# Patient Record
Sex: Male | Born: 1980 | Race: Black or African American | Hispanic: No | Marital: Single | Smoking: Never smoker
Health system: Southern US, Community
[De-identification: ages and names within clinical notes are randomized; demographics above are authoritative.]

## PROBLEM LIST (undated history)

## (undated) ENCOUNTER — Inpatient Hospital Stay: Payer: Self-pay | Admitting: Internal Medicine

## (undated) ENCOUNTER — Emergency Department (HOSPITAL_COMMUNITY): Admission: EM | Payer: Self-pay | Source: Home / Self Care

## (undated) DIAGNOSIS — Z8709 Personal history of other diseases of the respiratory system: Secondary | ICD-10-CM

## (undated) DIAGNOSIS — I96 Gangrene, not elsewhere classified: Secondary | ICD-10-CM

## (undated) HISTORY — PX: HAND SURGERY: SHX662

---

## 2015-09-04 ENCOUNTER — Emergency Department (HOSPITAL_COMMUNITY)
Admission: EM | Admit: 2015-09-04 | Discharge: 2015-09-04 | Disposition: A | Discharge status: Home / Self Care | Payer: Medicaid Other | Source: Home / Self Care | Attending: Emergency Medicine | Admitting: Emergency Medicine

## 2015-09-04 ENCOUNTER — Emergency Department (HOSPITAL_COMMUNITY): Payer: Medicaid Other

## 2015-09-04 ENCOUNTER — Encounter (HOSPITAL_COMMUNITY): Payer: Self-pay | Admitting: Emergency Medicine

## 2015-09-04 DIAGNOSIS — J189 Pneumonia, unspecified organism: Secondary | ICD-10-CM

## 2015-09-04 DIAGNOSIS — Z880 Allergy status to penicillin: Secondary | ICD-10-CM

## 2015-09-04 DIAGNOSIS — R519 Headache, unspecified: Secondary | ICD-10-CM

## 2015-09-04 DIAGNOSIS — J159 Unspecified bacterial pneumonia: Secondary | ICD-10-CM

## 2015-09-04 DIAGNOSIS — R51 Headache: Secondary | ICD-10-CM | POA: Insufficient documentation

## 2015-09-04 DIAGNOSIS — R112 Nausea with vomiting, unspecified: Secondary | ICD-10-CM

## 2015-09-04 DIAGNOSIS — R1084 Generalized abdominal pain: Secondary | ICD-10-CM | POA: Insufficient documentation

## 2015-09-04 DIAGNOSIS — R52 Pain, unspecified: Secondary | ICD-10-CM

## 2015-09-04 LAB — CBC WITH DIFFERENTIAL/PLATELET
BASOS ABS: 0 10*3/uL (ref 0.0–0.1)
Basophils Relative: 0 %
EOS ABS: 0 10*3/uL (ref 0.0–0.7)
EOS PCT: 0 %
HCT: 46.2 % (ref 39.0–52.0)
Hemoglobin: 16 g/dL (ref 13.0–17.0)
LYMPHS PCT: 18 %
Lymphs Abs: 0.8 10*3/uL (ref 0.7–4.0)
MCH: 30.2 pg (ref 26.0–34.0)
MCHC: 34.6 g/dL (ref 30.0–36.0)
MCV: 87.2 fL (ref 78.0–100.0)
MONO ABS: 0.7 10*3/uL (ref 0.1–1.0)
Monocytes Relative: 16 %
Neutro Abs: 2.8 10*3/uL (ref 1.7–7.7)
Neutrophils Relative %: 66 %
PLATELETS: 142 10*3/uL — AB (ref 150–400)
RBC: 5.3 MIL/uL (ref 4.22–5.81)
RDW: 12.3 % (ref 11.5–15.5)
WBC: 4.3 10*3/uL (ref 4.0–10.5)

## 2015-09-04 LAB — URINALYSIS, ROUTINE W REFLEX MICROSCOPIC
Glucose, UA: NEGATIVE mg/dL
Hgb urine dipstick: NEGATIVE
LEUKOCYTES UA: NEGATIVE
NITRITE: NEGATIVE
PROTEIN: 30 mg/dL — AB
Specific Gravity, Urine: 1.033 — ABNORMAL HIGH (ref 1.005–1.030)
pH: 7.5 (ref 5.0–8.0)

## 2015-09-04 LAB — COMPREHENSIVE METABOLIC PANEL
ALT: 21 U/L (ref 17–63)
AST: 37 U/L (ref 15–41)
Albumin: 3.9 g/dL (ref 3.5–5.0)
Alkaline Phosphatase: 65 U/L (ref 38–126)
Anion gap: 14 (ref 5–15)
BUN: 12 mg/dL (ref 6–20)
CHLORIDE: 102 mmol/L (ref 101–111)
CO2: 20 mmol/L — AB (ref 22–32)
Calcium: 8.6 mg/dL — ABNORMAL LOW (ref 8.9–10.3)
Creatinine, Ser: 1.29 mg/dL — ABNORMAL HIGH (ref 0.61–1.24)
Glucose, Bld: 106 mg/dL — ABNORMAL HIGH (ref 65–99)
POTASSIUM: 3.2 mmol/L — AB (ref 3.5–5.1)
SODIUM: 136 mmol/L (ref 135–145)
Total Bilirubin: 0.7 mg/dL (ref 0.3–1.2)
Total Protein: 7 g/dL (ref 6.5–8.1)

## 2015-09-04 LAB — URINE MICROSCOPIC-ADD ON
Bacteria, UA: NONE SEEN
RBC / HPF: NONE SEEN RBC/hpf (ref 0–5)

## 2015-09-04 LAB — LIPASE, BLOOD: LIPASE: 32 U/L (ref 11–51)

## 2015-09-04 LAB — RAPID STREP SCREEN (MED CTR MEBANE ONLY): STREPTOCOCCUS, GROUP A SCREEN (DIRECT): NEGATIVE

## 2015-09-04 MED ORDER — DICYCLOMINE HCL 20 MG PO TABS: 20.0000 mg | ORAL_TABLET | Freq: Two times a day (BID) | ORAL | Status: DC

## 2015-09-04 MED ORDER — BENZONATATE 100 MG PO CAPS: 100.0000 mg | ORAL_CAPSULE | Freq: Three times a day (TID) | ORAL | Status: DC

## 2015-09-04 MED ORDER — ONDANSETRON HCL 4 MG/2ML IJ SOLN
4.0000 mg | Freq: Once | INTRAMUSCULAR | Status: AC
  Administered 2015-09-04: 4 mg via INTRAVENOUS
  Filled 2015-09-04: qty 2

## 2015-09-04 MED ORDER — SODIUM CHLORIDE 0.9 % IV BOLUS (SEPSIS)
1000.0000 mL | Freq: Once | INTRAVENOUS | Status: AC
  Administered 2015-09-04: 1000 mL via INTRAVENOUS

## 2015-09-04 MED ORDER — SODIUM CHLORIDE 0.9 % IV SOLN
1000.0000 mL | Freq: Once | INTRAVENOUS | Status: AC
  Administered 2015-09-04: 1000 mL via INTRAVENOUS

## 2015-09-04 MED ORDER — SODIUM CHLORIDE 0.9 % IV SOLN: 1000.0000 mL | Freq: Once | INTRAVENOUS | Status: DC

## 2015-09-04 MED ORDER — SODIUM CHLORIDE 0.9 % IV SOLN: 1000.0000 mL | INTRAVENOUS | Status: DC

## 2015-09-04 MED ORDER — AZITHROMYCIN 250 MG PO TABS: 250.0000 mg | ORAL_TABLET | Freq: Every day | ORAL | Status: DC

## 2015-09-04 MED ORDER — KETOROLAC TROMETHAMINE 30 MG/ML IJ SOLN
30.0000 mg | Freq: Once | INTRAMUSCULAR | Status: AC
  Administered 2015-09-04: 30 mg via INTRAVENOUS
  Filled 2015-09-04: qty 1

## 2015-09-04 MED ORDER — DOXYCYCLINE HYCLATE 100 MG PO TABS: 100.0000 mg | ORAL_TABLET | Freq: Once | ORAL | Status: DC

## 2015-09-04 MED ORDER — AZITHROMYCIN 250 MG PO TABS
500.0000 mg | ORAL_TABLET | Freq: Once | ORAL | Status: AC
  Administered 2015-09-04: 500 mg via ORAL
  Filled 2015-09-04: qty 2

## 2015-09-04 MED ORDER — DICYCLOMINE HCL 10 MG PO CAPS
10.0000 mg | ORAL_CAPSULE | Freq: Once | ORAL | Status: AC
  Administered 2015-09-04: 10 mg via ORAL
  Filled 2015-09-04: qty 1

## 2015-09-04 MED ORDER — ONDANSETRON 4 MG PO TBDP: 4.0000 mg | ORAL_TABLET | Freq: Three times a day (TID) | ORAL | Status: DC | PRN

## 2015-09-04 NOTE — ED Notes (Signed)
Patient's family called asking why patient has not been seen yet. I explained that the patient was being actively cared for and that we were very busy and the doctor is currently rounding on patients and will get to him soon. Family asked if he needs to go to another hospital and I explained that if he did leave that he would need to go through this process all over again somewhere else and that he was still being actively cared for here. The family replied "fine" and hung up the phone.

## 2015-09-04 NOTE — ED Notes (Signed)
Pt stated "I've been sick x 4 days.  My body hurts all over, my stomach hurts, not really vomiting.  I took 1 abx this morning that someone gave me."  Pt denies diarrhea/vomiting.

## 2015-09-04 NOTE — ED Provider Notes (Signed)
Medical screening examination/treatment/procedure(s) were conducted as a shared visit with non-physician practitioner(s) and myself.  I personally evaluated the patient during the encounter.   EKG Interpretation None     Pt here with flu like sx x 4 days consisting of myalgias, sore throat, abd discomfort PE: abd soft, w/o focal tenderness Neck w/o meningismus  A/P: suspect viral illness Will d/c once hydrated  Lorre NickAnthony Tamu Golz, MD 09/04/15 [phone]

## 2015-09-04 NOTE — ED Notes (Signed)
Bed: [id number] Expected date: 09/04/15 Expected time: 1:40 PM Means of arrival:  Comments: N/V

## 2015-09-04 NOTE — ED Notes (Signed)
pts family came to asking about pt being seen explained that he just got here and that we are waiting on md to see pt. Explained that he has had fluids and zofran from ems. Given ice chips per pts request due to he was upset that he could not have anything to drink. Family wants to talk with pt to have him released to [facility]. Expressed that he would have to sign out AMA since the MD hasnot seen him. Explained also that we have been very busy with pts and that the MD will come see pt as soon as possible. Family states that he will talk to pt and let us know if he wants him to leave.

## 2015-09-04 NOTE — Discharge Instructions (Signed)
You have been seen today for body aches, nausea, vomiting, abdominal pain, and headache. Your imaging and lab tests showed no abnormalities. Her presentation is consistent with a viral illness, such as the flu. The treatment for viral illnesses is supportive care. Drink plenty of fluids and get plenty of rest. Zofran for nausea. Ibuprofen or Tylenol for fever or pain. Bentyl for abdominal discomfort. Follow up with PCP as needed. Return to ED should symptoms worsen.   Emergency Department Resource Guide 1) Find a Doctor and Pay Out of Pocket Although you won't have to find out who is covered by your insurance plan, it is a good idea to ask around and get recommendations. You will then need to call the office and see if the doctor you have chosen will accept you as a new patient and what types of options they offer for patients who are self-pay. Some doctors offer discounts or will set up payment plans for their patients who do not have insurance, but you will need to ask so you aren't surprised when you get to your appointment.  2) Contact Your Local Health Department Not all health departments have doctors that can see patients for sick visits, but many do, so it is worth a call to see if yours does. If you don't know where your local health department is, you can check in your phone book. The CDC also has a tool to help you locate your state's health department, and many state websites also have listings of all of their local health departments.  3) Find a Walk-in Clinic If your illness is not likely to be very severe or complicated, you may want to try a walk in clinic. These are popping up all over the country in pharmacies, drugstores, and shopping centers. They're usually staffed by nurse practitioners or physician assistants that have been trained to treat common illnesses and complaints. They're usually fairly quick and inexpensive. However, if you have serious medical issues or chronic medical  problems, these are probably not your best option.  No Primary Care Doctor: - Call Health Connect at  [phone] - they can help you locate a primary care doctor that  accepts your insurance, provides certain services, etc. - Physician Referral Service- [phone]  Chronic Pain Problems: Organization         Address  Phone   Notes  [facility]  [phone] Patients need to be referred by their primary care doctor.   Medication Assistance: Organization         Address  Phone   Notes  [facility] Medication [facility]ssistance Program [address] [place], [place] [phone] --Must be a resident of [facility] [place] -- Must have NO insurance coverage whatsoever (no Medicaid/ Medicare, etc.) -- The pt. MUST have a primary care doctor that directs their care regularly and follows them in the community   MedAssist  [phone]   [employer]  [phone]) [phone]    Agencies that provide inexpensive medical care: Organization         Address  Phone   Notes  [facility]  [phone]) [phone]   [facility]    [phone]   [facility] [address] [place], [place] [address] [phone]   [facility] [address], [place] [phone]) [phone]   [facility]anned Parenthood    [phone]   [facility]    [phone])  [phone]   [facility] and [employer]  [address], [place] Phone:  [phone], Fax:  [phone] Hours of Operation:  9 am - 6 pm, M-F.  Also accepts Medicaid/Medicare and self-pay.  [facility]  [address], [place] Phone: [phone], Fax: [phone]. Hours of Operation:  8:30 am - 5:30 pm, M-F.  Also accepts Medicaid and self-pay.  [facility] [place] [address], [place] Phone: [phone]   [facility] [address] Natasha Bence [place], [place] [phone], Ext. [phone]  Mondays & Thursdays: 7-9 AM.  First 15 patients are seen on a first come, first serve basis.    Medicaid-accepting [facility] Providers:  Organization         Address  Phone   Notes  [facility] [address], [place] [phone] Also accepts self-pay patients.  [facility] [address] Laurell Josephs [place], [place]  [phone]   [facility] [address], [place] [phone]   [facility] [address] [place] [phone]   Renaye Rakers [address], [place]   [phone] Only accepts [place] Access [place] patients after they have their name applied to their card.   Self-Pay (no insurance) in [facility]:  Organization         Address  Phone   Notes  Sickle Cell Patients, [facility] [address] [place], [place] [phone]   [facility] [address] [place], [place] [phone]   [facility] [place]  [address], [place] [phone]   [facility]/Dr. Osei-Bonsu  [address] [place] or [address], [place] [phone] Phone number for both [place] and [place] locations is the same.  Urgent Medical and [facility] [address], [place] [phone]   [facility] [address] [place] or [address] [phone]   [facility] [address] [place] [phone], phone; [phone], fax Sees patients 1st and 3rd Saturday of every month.  Must not qualify for public or private insurance (i.e. Medicaid, Medicare, [place] Health Choice, Veterans' Benefits)  Household income should be no more than 200% of the poverty level The clinic cannot treat you if you are pregnant or think you are pregnant  Sexually transmitted diseases are not treated at  the clinic.    Dental Care: Organization         Address  Phone  Notes  [facility] [address] [place], [place] [phone] Accepts children up to age 57 who are enrolled in [place] or [place] Health Choice; pregnant women with a Medicaid card; and children who have applied for Medicaid or [place] Health Choice, but were declined, whose parents can pay a reduced fee at time of service.  [facility] of [facility]  [address], [place] [phone] Accepts children up to age 46 who are enrolled in [place] or [place] Health Choice; pregnant women with a Medicaid card; and children who have applied for Medicaid or  Health Choice, but were declined, whose parents can pay a reduced fee at time of service.  [facility]  PROGRAM  [place] [phone] Patients are seen by appointment only. Walk-ins are not accepted. [place] will see patients 63 years of age and older. Monday - Tuesday (8am-5pm) Most Wednesdays (8:30-5pm) $30 per visit, cash only  [facility] Adult Dental Access PROGRAM  [address], [facility] [phone] Patients are seen by appointment only. Walk-ins are not accepted. [place] will see patients 39 years of age and older. One Wednesday Evening (Monthly: Volunteer Based).  $30 per visit, cash only  [place]  [phone] for adults; Children under age 63, call Graduate Pediatric Dentistry at [phone]. Children aged 87-14, please call [phone] to request a pediatric application.  Dental services are provided in all areas of dental care including fillings, crowns and bridges, complete and partial dentures, implants, gum treatment, root canals, and extractions. Preventive care is also provided. Treatment is provided to both adults and children. Patients are selected via a lottery and there is often a  waiting list.   [facility] [address], [place]  [phone] [email or web address]   [facility] [address] [place], [place] [phone], Ext. [phone] Second and Fourth Thursday of each month, opens at 6:30 AM; Clinic ends at 9 AM.  Patients are seen on a first-come first-served basis, and a limited number are seen during each clinic.   [facility]  [address] Hillard Danker [place], [place] [phone]   Eligibility Requirements You must have lived in [place], [place], or [place] counties for at least the last three months.   You cannot be eligible for state or federal sponsored [employer], including [employer], [place], or [employer].   You generally cannot be eligible for healthcare insurance through your employer.    How to apply: Eligibility screenings are held every Tuesday and Wednesday afternoon from 1:00 pm until 4:00 pm. You do not need an appointment for the interview!  [facility] [address] [place], [place]   [place]  [place] Department  [place]  [phone]    Behavioral Health Resources in the Community: Intensive Outpatient Programs Organization         Address  Phone  Notes  [place]. [address] [place], [place] [phone]   [facility] Outpatient [address] [place], [place]   ADS: Alcohol [facility] Drug S[facility] [address] [place], [place]   [place] [address]  [place], [place] or [phone]   Substance Abuse Resources Organization         Address  Phone  Notes  Alcohol and Drug Services  [phone]   [place]  [phone]   [facility] [place]   Chinita Pester  [phone]   Residential & Outpatient Substance Abuse  Program  [phone]   Psychological Services Organization         Address  Phone  Notes  [facility] [place]  [place]  [phone]   [place] [address], [place] or [phone]    Mobile Crisis Teams Organization         Address  Phone  Notes  [facility], Mobile Crisis Care Unit  [phone]   [facility]  [address] [place], [place]  [place], [place] [phone]    Self-Help/Support Groups Organization         Address  Phone             Notes  [facility] of [place] - variety of support groups  [place] Call for more information  Narcotics Anonymous (NA), [facility]aring [facility] [address], [employer] [place]  2 meetings at this location   Special educational needs teacher         Address  Phone  Notes  [facility]idential Treatment [place],    [place]  [phone]   [facility]  [address], [place] [phone], [place], [place]   [place], [place] [phone] Admissions: 8am-3pm M-F  Incentives Substance [place] [address]    [place], [place] [phone]   [facility] [address] [place], [place], [place]   [facility] [address]  [place], [place]   [facility] Programs - Intensive Outpatient [address] Kristeen Mans [address], [place], [place]   [facility] ([place].,  [place], [place] [phone] or [phone]   [facility] (RTS) [address], [place], [place] Accepts Medicaid  [facility] [place] [address]  [place] [phone] Substance Abuse/Addiction Treatment   [facility] Organization          Address  Phone  Notes  [facility]  [phone]   Domenic Schwab, PhD [address] Arlis Porta [place], [place]   [phone] or [phone]   [place] Baxter [place], [place] ([phone]   [facility] [address] [place], [place] [phone] Insurance/Medicaid/sponsorship through [facility] and Families [address]                                    [place], [place] [phone] [place] [address], [place] [phone]    Dr. Adele Schilder  [phone]   [facility] Dept. 1) [address], [place] 2) [place] 3)  [address], [place] [phone]  [phone]   [place] [phone] or [phone] (After Hours)

## 2015-09-04 NOTE — ED Notes (Signed)
Patient transported to X-ray 

## 2015-09-04 NOTE — ED Notes (Signed)
Awake. Verbally responsive. A/O x4. Resp even and unlabored. No audible adventitious breath sounds noted. ABC's intact.  

## 2015-09-04 NOTE — ED Notes (Addendum)
N/v for the past 4 days now, has not checked temp. Drinking minimal fluids, taking day quil not helping. Coughing trigger emesis and worse at night. 4mg  zofran with ems no witnesses emesis since cbg 124

## 2015-09-04 NOTE — ED Provider Notes (Signed)
CSN: [id number]     Arrival date & time 09/04/15  1343 History   First MD Initiated Contact with Patient 09/04/15 1457     Chief Complaint  Patient presents with  . Nausea  . Emesis     (Consider location/radiation/quality/duration/timing/severity/associated sxs/prior Treatment) HPI   Matthew [name] is a 35 y.o. male, patient with no pertinent past medical history, presenting to the ED with body aches, nausea, abdominal pain, subjective fever, and headache for the last 4 days. Pt endorses vomiting during the first two days of his illness. Pt denies sick contacts. Pt states his abdominal pain is periumbilical, 10/10, "feels like a knot," nonradiating. States the headache is bilateral, "all over," rates it 10/10, throbbing, nonradiating. Pt has not taken any medications. Pt denies chills, diarrhea/constipation, chest pain, cough, shortness of breath, or any other pain or complaints.   History reviewed. No pertinent past medical history. History reviewed. No pertinent past surgical history. No family history on file. Social History  Substance Use Topics  . Smoking status: None  . Smokeless tobacco: None  . Alcohol Use: None    Review of Systems  Constitutional: Positive for fever. Negative for chills and diaphoresis.  Respiratory: Negative for cough and shortness of breath.   Cardiovascular: Negative for chest pain.  Gastrointestinal: Positive for nausea, vomiting and abdominal pain. Negative for diarrhea and constipation.  Genitourinary: Negative for dysuria and flank pain.  Musculoskeletal: Positive for myalgias. Negative for neck stiffness.  Skin: Negative for color change and pallor.  Neurological: Positive for headaches. Negative for dizziness, weakness, light-headedness and numbness.  All other systems reviewed and are negative.     Allergies  Penicillins  Home Medications   Prior to Admission medications   Medication Sig Start Date End Date Taking? Authorizing Provider   azithromycin (ZITHROMAX) 250 MG tablet Take 1 tablet (250 mg total) by mouth daily. Take first 2 tablets together, then 1 every day until finished. 09/05/15   Shawn C Joy, PA-C  benzonatate (TESSALON) 100 MG capsule Take 1 capsule (100 mg total) by mouth every 8 (eight) hours. 09/04/15   Shawn C Joy, PA-C  dicyclomine (BENTYL) 20 MG tablet Take 1 tablet (20 mg total) by mouth 2 (two) times daily. 09/04/15   Shawn C Joy, PA-C  ondansetron (ZOFRAN ODT) 4 MG disintegrating tablet Take 1 tablet (4 mg total) by mouth every 8 (eight) hours as needed for nausea or vomiting. 09/04/15   Shawn C Joy, PA-C   BP 114/61 mmHg  Pulse 84  Temp(Src) 99.2 F (37.3 C) (Oral)  Resp 16  SpO2 100% Physical Exam  Constitutional: He is oriented to person, place, and time. He appears well-developed and well-nourished. No distress.  HENT:  Head: Normocephalic and atraumatic.  Mouth/Throat: Uvula is midline and mucous membranes are normal. Posterior oropharyngeal erythema present. No oropharyngeal exudate or tonsillar abscesses.  Eyes: Conjunctivae and EOM are normal. Pupils are equal, round, and reactive to light.  Neck: Normal range of motion. Neck supple.  Cardiovascular: Normal rate, regular rhythm, normal heart sounds and intact distal pulses.   Pulmonary/Chest: Effort normal and breath sounds normal. No respiratory distress.  Abdominal: Soft. Normal appearance and bowel sounds are normal. There is generalized tenderness. There is no rigidity, no tenderness at McBurney's point and negative Murphy's sign.  Patient voices generalized tenderness in the abdomen with no point tenderness and no rebound tenderness.  Musculoskeletal: He exhibits no edema or tenderness.  Lymphadenopathy:    He has no cervical adenopathy.  Neurological: He is alert and oriented to person, place, and time. He has normal reflexes.  No sensory deficits. Strength 5/5 in all extremities. No gait disturbance. Coordination intact. Cranial nerves  III-XII grossly intact. No facial droop.   Skin: Skin is warm and dry. He is not diaphoretic.  Nursing note and vitals reviewed.   ED Course  Procedures (including critical care time) Labs Review Labs Reviewed  CBC WITH DIFFERENTIAL/PLATELET - Abnormal; Notable for the following:    Platelets 142 (*)    All other components within normal limits  COMPREHENSIVE METABOLIC PANEL - Abnormal; Notable for the following:    Potassium 3.2 (*)    CO2 20 (*)    Glucose, Bld 106 (*)    Creatinine, Ser 1.29 (*)    Calcium 8.6 (*)    All other components within normal limits  URINALYSIS, ROUTINE W REFLEX MICROSCOPIC (NOT AT [facility]) - Abnormal; Notable for the following:    Color, Urine AMBER (*)    Specific Gravity, Urine 1.033 (*)    Bilirubin Urine SMALL (*)    Ketones, ur >80 (*)    Protein, ur 30 (*)    All other components within normal limits  URINE MICROSCOPIC-ADD ON - Abnormal; Notable for the following:    Squamous Epithelial / LPF 0-5 (*)    All other components within normal limits  RAPID STREP SCREEN (NOT AT [facility])  CULTURE, GROUP A STREP  LIPASE, BLOOD    Imaging Review Dg Chest 2 View  09/04/2015  CLINICAL DATA:  Weakness and cough.  Fever EXAM: CHEST  2 VIEW COMPARISON:  None FINDINGS: Patchy density in the left base is noted, suspicious for pneumonia. Right lung is clear. No pleural effusion or edema. No airspace consolidation. IMPRESSION: 1. Left base opacity suspicious for pneumonia. Electronically Signed   By: Signa Kellaylor  Stroud M.D.   On: 09/04/2015 17:00   I have personally reviewed and evaluated these images and lab results as part of my medical decision-making.   EKG Interpretation None      Medications  ondansetron (ZOFRAN) injection 4 mg (not administered)  sodium chloride 0.9 % bolus 1,000 mL (0 mLs Intravenous Stopped 09/04/15 1645)  ketorolac (TORADOL) 30 MG/ML injection 30 mg (30 mg Intravenous Given 09/04/15 1613)  ondansetron (ZOFRAN) injection 4 mg (4 mg  Intravenous Given 09/04/15 1612)  dicyclomine (BENTYL) capsule 10 mg (10 mg Oral Given 09/04/15 1603)  0.9 %  sodium chloride infusion (0 mLs Intravenous Stopped 09/04/15 1754)  azithromycin (ZITHROMAX) tablet 500 mg (500 mg Oral Given 09/04/15 1752)     MDM   Final diagnoses:  Generalized abdominal pain  Non-intractable vomiting with nausea, vomiting of unspecified type  Body aches  Acute nonintractable headache, unspecified headache type  CAP (community acquired pneumonia)    Matthew BearsJay [name] presents with body aches, nausea and vomiting, abdominal pain, and headache for the last 4 days.  Findings and plan of care discussed with Lorre NickAnthony Allen, MD.  This patient's presentation is consistent with a possible viral illness such as influenza. Although he voices generalized tenderness in the abdomen, upon repeat exam he endorses no tenderness or pain. Patient's pain and nausea were able to be controlled with mildly conservative management here in the ED. Upon Dr. Eliot FordAllen's assessment the patient then voices, "also my throat feels weird." Patient does not endorse any pain in his throat at this time. Patient also adds that he has had a nonproductive cough over the last 2-3 days. Patient's chest x-ray shows  a sitting suspicious for left lower lobe pneumonia. Suspect that the patient's increased creatinine is probably due to dehydration. Patient is nontoxic appearing, is afebrile, not tachycardic, not tachypneic, is normotensive, and maintains SPO2 100% on room air. 5:34 PM On reassessment patient remains pain-free with no changes in his abdominal exam. Patient appears to be appropriate for outpatient management. Patient readily passes a fluid challenge. The patient was given instructions for home care as well as return precautions. Patient voices understanding of these instructions, accepts the plan, and is comfortable with discharge.  Filed Vitals:   09/04/15 1411 09/04/15 1526 09/04/15 1706  BP: 105/88  114/61   Pulse: 85  84  Temp:  99.2 F (37.3 C)   TempSrc:  Oral   Resp: 18  16  SpO2: 100%  100%     Anselm Pancoast, PA-C 09/04/15 1846  Lorre Nick, MD 09/07/15 [phone]

## 2015-09-04 NOTE — ED Notes (Signed)
Pt provided Sprite for fluid challenged & instructed to take small sips.  Pt verbalized understanding.

## 2015-09-04 NOTE — ED Notes (Signed)
Pt informed needs to provide urine specimen.  Pt verbalized understanding.

## 2015-09-04 NOTE — ED Notes (Signed)
Pt spitting up clear fluid while staff at bedside, no emesis seen. Pt states that he was coughing and began to "spit up"

## 2015-09-05 ENCOUNTER — Inpatient Hospital Stay (HOSPITAL_COMMUNITY): Payer: Medicaid Other

## 2015-09-05 ENCOUNTER — Inpatient Hospital Stay (HOSPITAL_COMMUNITY)
Admission: EM | Admit: 2015-09-05 | Discharge: 2015-09-06 | DRG: 003 | Disposition: A | Discharge status: Other Acute Inpatient Hospital | Payer: Medicaid Other | Attending: Pulmonary Disease | Admitting: Pulmonary Disease

## 2015-09-05 ENCOUNTER — Encounter (HOSPITAL_COMMUNITY): Payer: Self-pay | Admitting: *Deleted

## 2015-09-05 ENCOUNTER — Emergency Department (HOSPITAL_COMMUNITY): Payer: Medicaid Other

## 2015-09-05 DIAGNOSIS — I400 Infective myocarditis: Secondary | ICD-10-CM | POA: Diagnosis present

## 2015-09-05 DIAGNOSIS — I248 Other forms of acute ischemic heart disease: Secondary | ICD-10-CM | POA: Diagnosis present

## 2015-09-05 DIAGNOSIS — J1000 Influenza due to other identified influenza virus with unspecified type of pneumonia: Secondary | ICD-10-CM | POA: Diagnosis present

## 2015-09-05 DIAGNOSIS — R000 Tachycardia, unspecified: Secondary | ICD-10-CM

## 2015-09-05 DIAGNOSIS — R042 Hemoptysis: Secondary | ICD-10-CM | POA: Diagnosis present

## 2015-09-05 DIAGNOSIS — D61818 Other pancytopenia: Secondary | ICD-10-CM | POA: Diagnosis present

## 2015-09-05 DIAGNOSIS — I472 Ventricular tachycardia: Secondary | ICD-10-CM | POA: Diagnosis present

## 2015-09-05 DIAGNOSIS — E876 Hypokalemia: Secondary | ICD-10-CM | POA: Diagnosis present

## 2015-09-05 DIAGNOSIS — D759 Disease of blood and blood-forming organs, unspecified: Secondary | ICD-10-CM | POA: Diagnosis present

## 2015-09-05 DIAGNOSIS — D696 Thrombocytopenia, unspecified: Secondary | ICD-10-CM | POA: Diagnosis present

## 2015-09-05 DIAGNOSIS — J189 Pneumonia, unspecified organism: Secondary | ICD-10-CM | POA: Insufficient documentation

## 2015-09-05 DIAGNOSIS — E872 Acidosis, unspecified: Secondary | ICD-10-CM | POA: Insufficient documentation

## 2015-09-05 DIAGNOSIS — D72819 Decreased white blood cell count, unspecified: Secondary | ICD-10-CM | POA: Diagnosis present

## 2015-09-05 DIAGNOSIS — J80 Acute respiratory distress syndrome: Secondary | ICD-10-CM | POA: Diagnosis present

## 2015-09-05 DIAGNOSIS — J9600 Acute respiratory failure, unspecified whether with hypoxia or hypercapnia: Secondary | ICD-10-CM | POA: Diagnosis present

## 2015-09-05 DIAGNOSIS — R6521 Severe sepsis with septic shock: Secondary | ICD-10-CM | POA: Diagnosis present

## 2015-09-05 DIAGNOSIS — R34 Anuria and oliguria: Secondary | ICD-10-CM | POA: Diagnosis present

## 2015-09-05 DIAGNOSIS — A419 Sepsis, unspecified organism: Secondary | ICD-10-CM | POA: Diagnosis present

## 2015-09-05 DIAGNOSIS — Z4659 Encounter for fitting and adjustment of other gastrointestinal appliance and device: Secondary | ICD-10-CM

## 2015-09-05 DIAGNOSIS — R0902 Hypoxemia: Secondary | ICD-10-CM

## 2015-09-05 DIAGNOSIS — J9601 Acute respiratory failure with hypoxia: Secondary | ICD-10-CM | POA: Insufficient documentation

## 2015-09-05 DIAGNOSIS — N179 Acute kidney failure, unspecified: Secondary | ICD-10-CM | POA: Insufficient documentation

## 2015-09-05 DIAGNOSIS — Z452 Encounter for adjustment and management of vascular access device: Secondary | ICD-10-CM

## 2015-09-05 LAB — RAPID HIV SCREEN (HIV 1/2 AB+AG)
HIV 1/2 Antibodies: NONREACTIVE
HIV-1 P24 Antigen - HIV24: NONREACTIVE

## 2015-09-05 LAB — AMYLASE: AMYLASE: 70 U/L (ref 28–100)

## 2015-09-05 LAB — TROPONIN I: TROPONIN I: 0.07 ng/mL — AB (ref <0.031)

## 2015-09-05 LAB — I-STAT ARTERIAL BLOOD GAS, ED
ACID-BASE DEFICIT: 13 mmol/L — AB (ref 0.0–2.0)
Bicarbonate: 14.3 mEq/L — ABNORMAL LOW (ref 20.0–24.0)
O2 SAT: 99 %
PH ART: 7.183 — AB (ref 7.350–7.450)
Patient temperature: 103
TCO2: 15 mmol/L (ref 0–100)
pCO2 arterial: 39.3 mmHg (ref 35.0–45.0)
pO2, Arterial: 205 mmHg — ABNORMAL HIGH (ref 80.0–100.0)

## 2015-09-05 LAB — CBC WITH DIFFERENTIAL/PLATELET
BASOS ABS: 0 10*3/uL (ref 0.0–0.1)
Basophils Absolute: 0 10*3/uL (ref 0.0–0.1)
Basophils Relative: 1 %
Basophils Relative: 1 %
EOS PCT: 0 %
Eosinophils Absolute: 0 10*3/uL (ref 0.0–0.7)
Eosinophils Absolute: 0 10*3/uL (ref 0.0–0.7)
Eosinophils Relative: 0 %
HEMATOCRIT: 45.5 % (ref 39.0–52.0)
HEMATOCRIT: 39.1 % (ref 39.0–52.0)
HEMOGLOBIN: 15.8 g/dL (ref 13.0–17.0)
HEMOGLOBIN: 13.5 g/dL (ref 13.0–17.0)
LYMPHS ABS: 0.3 10*3/uL — AB (ref 0.7–4.0)
LYMPHS ABS: 0.4 10*3/uL — AB (ref 0.7–4.0)
LYMPHS PCT: 31 %
Lymphocytes Relative: 20 %
MCH: 30.4 pg (ref 26.0–34.0)
MCH: 30.1 pg (ref 26.0–34.0)
MCHC: 34.7 g/dL (ref 30.0–36.0)
MCHC: 34.5 g/dL (ref 30.0–36.0)
MCV: 87.5 fL (ref 78.0–100.0)
MCV: 87.3 fL (ref 78.0–100.0)
MONO ABS: 0.2 10*3/uL (ref 0.1–1.0)
MONOS PCT: 9 %
Monocytes Absolute: 0.1 10*3/uL (ref 0.1–1.0)
Monocytes Relative: 10 %
NEUTROS PCT: 69 %
NEUTROS PCT: 59 %
Neutro Abs: 1 10*3/uL — ABNORMAL LOW (ref 1.7–7.7)
Neutro Abs: 0.7 10*3/uL — ABNORMAL LOW (ref 1.7–7.7)
Platelets: 90 10*3/uL — ABNORMAL LOW (ref 150–400)
Platelets: 70 10*3/uL — ABNORMAL LOW (ref 150–400)
RBC: 5.2 MIL/uL (ref 4.22–5.81)
RBC: 4.48 MIL/uL (ref 4.22–5.81)
RDW: 12.6 % (ref 11.5–15.5)
RDW: 12.5 % (ref 11.5–15.5)
WBC MORPHOLOGY: INCREASED
WBC Morphology: INCREASED
WBC: 1.5 10*3/uL — AB (ref 4.0–10.5)
WBC: 1.2 10*3/uL — AB (ref 4.0–10.5)

## 2015-09-05 LAB — D-DIMER, QUANTITATIVE: D-Dimer, Quant: 6.46 ug/mL-FEU — ABNORMAL HIGH (ref 0.00–0.50)

## 2015-09-05 LAB — COMPREHENSIVE METABOLIC PANEL
ALBUMIN: 2.9 g/dL — AB (ref 3.5–5.0)
ALK PHOS: 42 U/L (ref 38–126)
ALT: <5 U/L — ABNORMAL LOW (ref 17–63)
ANION GAP: 13 (ref 5–15)
AST: 69 U/L — ABNORMAL HIGH (ref 15–41)
AST: 75 U/L — ABNORMAL HIGH (ref 15–41)
Albumin: 2.4 g/dL — ABNORMAL LOW (ref 3.5–5.0)
Alkaline Phosphatase: 35 U/L — ABNORMAL LOW (ref 38–126)
Anion gap: 14 (ref 5–15)
BILIRUBIN TOTAL: 0.6 mg/dL (ref 0.3–1.2)
BUN: 8 mg/dL (ref 6–20)
BUN: 8 mg/dL (ref 6–20)
CALCIUM: 7.8 mg/dL — AB (ref 8.9–10.3)
CHLORIDE: 106 mmol/L (ref 101–111)
CO2: 21 mmol/L — ABNORMAL LOW (ref 22–32)
CO2: 17 mmol/L — AB (ref 22–32)
CREATININE: 1.6 mg/dL — AB (ref 0.61–1.24)
Calcium: 6.8 mg/dL — ABNORMAL LOW (ref 8.9–10.3)
Chloride: 99 mmol/L — ABNORMAL LOW (ref 101–111)
Creatinine, Ser: 1.8 mg/dL — ABNORMAL HIGH (ref 0.61–1.24)
GFR calc non Af Amer: 55 mL/min — ABNORMAL LOW (ref >60)
GFR, EST AFRICAN AMERICAN: 55 mL/min — AB (ref >60)
GFR, EST NON AFRICAN AMERICAN: 47 mL/min — AB (ref >60)
Glucose, Bld: 173 mg/dL — ABNORMAL HIGH (ref 65–99)
Glucose, Bld: 114 mg/dL — ABNORMAL HIGH (ref 65–99)
POTASSIUM: 3.4 mmol/L — AB (ref 3.5–5.1)
POTASSIUM: 3.9 mmol/L (ref 3.5–5.1)
SODIUM: 133 mmol/L — AB (ref 135–145)
SODIUM: 137 mmol/L (ref 135–145)
TOTAL PROTEIN: 5.4 g/dL — AB (ref 6.5–8.1)
Total Bilirubin: 1.4 mg/dL — ABNORMAL HIGH (ref 0.3–1.2)
Total Protein: 4.7 g/dL — ABNORMAL LOW (ref 6.5–8.1)

## 2015-09-05 LAB — CORTISOL: CORTISOL PLASMA: 58 ug/dL

## 2015-09-05 LAB — PROTIME-INR
INR: 1.47 (ref 0.00–1.49)
Prothrombin Time: 17.9 seconds — ABNORMAL HIGH (ref 11.6–15.2)

## 2015-09-05 LAB — CBC
HEMATOCRIT: 41.8 % (ref 39.0–52.0)
Hemoglobin: 14.7 g/dL (ref 13.0–17.0)
MCH: 30.4 pg (ref 26.0–34.0)
MCHC: 35.2 g/dL (ref 30.0–36.0)
MCV: 86.5 fL (ref 78.0–100.0)
Platelets: 85 10*3/uL — ABNORMAL LOW (ref 150–400)
RBC: 4.83 MIL/uL (ref 4.22–5.81)
RDW: 12.7 % (ref 11.5–15.5)
WBC: 0.9 10*3/uL — CL (ref 4.0–10.5)

## 2015-09-05 LAB — URINALYSIS, ROUTINE W REFLEX MICROSCOPIC
Bilirubin Urine: NEGATIVE
GLUCOSE, UA: 100 mg/dL — AB
KETONES UR: 15 mg/dL — AB
Nitrite: NEGATIVE
PH: 6 (ref 5.0–8.0)
Specific Gravity, Urine: 1.025 (ref 1.005–1.030)

## 2015-09-05 LAB — I-STAT CG4 LACTIC ACID, ED
LACTIC ACID, VENOUS: 5.79 mmol/L — AB (ref 0.5–2.0)
LACTIC ACID, VENOUS: 6.42 mmol/L — AB (ref 0.5–2.0)

## 2015-09-05 LAB — BASIC METABOLIC PANEL
ANION GAP: 15 (ref 5–15)
BUN: 8 mg/dL (ref 6–20)
CHLORIDE: 102 mmol/L (ref 101–111)
CO2: 16 mmol/L — ABNORMAL LOW (ref 22–32)
Calcium: 6.6 mg/dL — ABNORMAL LOW (ref 8.9–10.3)
Creatinine, Ser: 1.65 mg/dL — ABNORMAL HIGH (ref 0.61–1.24)
GFR calc Af Amer: >60 mL/min (ref >60)
GFR, EST NON AFRICAN AMERICAN: 53 mL/min — AB (ref >60)
Glucose, Bld: 141 mg/dL — ABNORMAL HIGH (ref 65–99)
POTASSIUM: 3.1 mmol/L — AB (ref 3.5–5.1)
SODIUM: 133 mmol/L — AB (ref 135–145)

## 2015-09-05 LAB — BRAIN NATRIURETIC PEPTIDE: B Natriuretic Peptide: 114.9 pg/mL — ABNORMAL HIGH (ref 0.0–100.0)

## 2015-09-05 LAB — PROCALCITONIN: Procalcitonin: 50.08 ng/mL

## 2015-09-05 LAB — PHOSPHORUS: PHOSPHORUS: 2 mg/dL — AB (ref 2.5–4.6)

## 2015-09-05 LAB — LIPASE, BLOOD: LIPASE: 22 U/L (ref 11–51)

## 2015-09-05 LAB — URINE MICROSCOPIC-ADD ON

## 2015-09-05 LAB — ABO/RH: ABO/RH(D): O POS

## 2015-09-05 LAB — MAGNESIUM: Magnesium: 1.3 mg/dL — ABNORMAL LOW (ref 1.7–2.4)

## 2015-09-05 LAB — LACTIC ACID, PLASMA: LACTIC ACID, VENOUS: 6.8 mmol/L — AB (ref 0.5–2.0)

## 2015-09-05 LAB — APTT: aPTT: 37 seconds (ref 24–37)

## 2015-09-05 MED ORDER — SODIUM CHLORIDE 0.9 % IV SOLN
25.0000 ug/h | INTRAVENOUS | Status: DC
  Administered 2015-09-05: 25 ug/h via INTRAVENOUS
  Filled 2015-09-05: qty 50

## 2015-09-05 MED ORDER — VANCOMYCIN HCL 500 MG IV SOLR
500.0000 mg | Freq: Two times a day (BID) | INTRAVENOUS | Status: DC
  Administered 2015-09-06: 500 mg via INTRAVENOUS
  Filled 2015-09-05 (×3): qty 500

## 2015-09-05 MED ORDER — FENTANYL CITRATE (PF) 100 MCG/2ML IJ SOLN
50.0000 ug | Freq: Once | INTRAMUSCULAR | Status: AC
  Filled 2015-09-05: qty 2

## 2015-09-05 MED ORDER — SODIUM CHLORIDE 0.9 % IV BOLUS (SEPSIS)
500.0000 mL | Freq: Once | INTRAVENOUS | Status: AC
  Administered 2015-09-05: 500 mL via INTRAVENOUS

## 2015-09-05 MED ORDER — ROCURONIUM BROMIDE 50 MG/5ML IV SOLN
INTRAVENOUS | Status: AC | PRN
  Administered 2015-09-05: 50 mg via INTRAVENOUS

## 2015-09-05 MED ORDER — ETOMIDATE 2 MG/ML IV SOLN: 20.0000 mg | Freq: Once | INTRAVENOUS | Status: AC

## 2015-09-05 MED ORDER — PROPOFOL 1000 MG/100ML IV EMUL
5.0000 ug/kg/min | INTRAVENOUS | Status: DC
  Administered 2015-09-05: 5 ug/kg/min via INTRAVENOUS

## 2015-09-05 MED ORDER — SODIUM CHLORIDE 0.9 % IV SOLN
250.0000 mL | INTRAVENOUS | Status: DC | PRN
  Administered 2015-09-06: 15:00:00 via INTRAVENOUS

## 2015-09-05 MED ORDER — OSELTAMIVIR PHOSPHATE 75 MG PO CAPS: 75.0000 mg | ORAL_CAPSULE | Freq: Two times a day (BID) | ORAL | Status: DC

## 2015-09-05 MED ORDER — LEVOFLOXACIN IN D5W 750 MG/150ML IV SOLN: 750.0000 mg | INTRAVENOUS | Status: DC

## 2015-09-05 MED ORDER — LEVOFLOXACIN IN D5W 750 MG/150ML IV SOLN
750.0000 mg | INTRAVENOUS | Status: DC
  Administered 2015-09-05: 750 mg via INTRAVENOUS
  Filled 2015-09-05: qty 150

## 2015-09-05 MED ORDER — SODIUM CHLORIDE 0.9 % IV SOLN
1.0000 mg/h | INTRAVENOUS | Status: DC
  Administered 2015-09-05: 1 mg/h via INTRAVENOUS
  Filled 2015-09-05: qty 10

## 2015-09-05 MED ORDER — SODIUM CHLORIDE 0.9 % IV BOLUS (SEPSIS)
1000.0000 mL | Freq: Once | INTRAVENOUS | Status: AC
  Administered 2015-09-05: 1000 mL via INTRAVENOUS

## 2015-09-05 MED ORDER — SODIUM CHLORIDE 0.9 % IV SOLN
INTRAVENOUS | Status: AC
  Administered 2015-09-05: 21:00:00 via INTRAVENOUS

## 2015-09-05 MED ORDER — ACETAMINOPHEN 325 MG PO TABS
ORAL_TABLET | ORAL | Status: AC
  Filled 2015-09-05: qty 2

## 2015-09-05 MED ORDER — FENTANYL CITRATE (PF) 100 MCG/2ML IJ SOLN: 100.0000 ug | INTRAMUSCULAR | Status: DC | PRN

## 2015-09-05 MED ORDER — OSELTAMIVIR PHOSPHATE 6 MG/ML PO SUSR
75.0000 mg | Freq: Two times a day (BID) | ORAL | Status: DC
  Administered 2015-09-05: 75 mg
  Filled 2015-09-05 (×3): qty 12.5

## 2015-09-05 MED ORDER — DEXTROSE 5 % IV SOLN
500.0000 mg | Freq: Once | INTRAVENOUS | Status: DC
  Filled 2015-09-05: qty 500

## 2015-09-05 MED ORDER — SODIUM CHLORIDE 0.9 % IV BOLUS (SEPSIS): 500.0000 mL | Freq: Once | INTRAVENOUS | Status: DC

## 2015-09-05 MED ORDER — MIDAZOLAM HCL 2 MG/2ML IJ SOLN
2.0000 mg | INTRAMUSCULAR | Status: DC | PRN
  Administered 2015-09-05 (×2): 2 mg via INTRAVENOUS
  Filled 2015-09-05 (×2): qty 2

## 2015-09-05 MED ORDER — FENTANYL CITRATE (PF) 100 MCG/2ML IJ SOLN
100.0000 ug | Freq: Once | INTRAMUSCULAR | Status: AC
  Administered 2015-09-05: 100 ug via INTRAVENOUS
  Filled 2015-09-05: qty 2

## 2015-09-05 MED ORDER — MIDAZOLAM HCL 2 MG/2ML IJ SOLN: 2.0000 mg | INTRAMUSCULAR | Status: DC | PRN

## 2015-09-05 MED ORDER — FENTANYL CITRATE (PF) 100 MCG/2ML IJ SOLN
100.0000 ug | INTRAMUSCULAR | Status: DC | PRN
  Administered 2015-09-05 (×2): 100 ug via INTRAVENOUS
  Filled 2015-09-05 (×2): qty 2

## 2015-09-05 MED ORDER — ROCURONIUM BROMIDE 50 MG/5ML IV SOLN: 50.0000 mg | Freq: Once | INTRAVENOUS | Status: AC

## 2015-09-05 MED ORDER — PANTOPRAZOLE SODIUM 40 MG IV SOLR
40.0000 mg | Freq: Every day | INTRAVENOUS | Status: DC
Start: 2015-09-05 — End: 2015-09-06
  Administered 2015-09-05: 40 mg via INTRAVENOUS
  Filled 2015-09-05 (×2): qty 40

## 2015-09-05 MED ORDER — SODIUM CHLORIDE 0.9 % IV BOLUS (SEPSIS)
1000.0000 mL | INTRAVENOUS | Status: AC
Start: 2015-09-05 — End: 2015-09-05
  Administered 2015-09-05: 1000 mL via INTRAVENOUS

## 2015-09-05 MED ORDER — VANCOMYCIN HCL IN DEXTROSE 1-5 GM/200ML-% IV SOLN
1000.0000 mg | Freq: Once | INTRAVENOUS | Status: AC
  Administered 2015-09-05: 1000 mg via INTRAVENOUS
  Filled 2015-09-05: qty 200

## 2015-09-05 MED ORDER — FENTANYL CITRATE (PF) 100 MCG/2ML IJ SOLN
INTRAMUSCULAR | Status: AC | PRN
  Administered 2015-09-05: 50 ug via INTRAVENOUS

## 2015-09-05 MED ORDER — NOREPINEPHRINE BITARTRATE 1 MG/ML IV SOLN
0.0000 ug/min | Freq: Once | INTRAVENOUS | Status: AC
  Administered 2015-09-05: 5 ug/min via INTRAVENOUS
  Filled 2015-09-05: qty 4

## 2015-09-05 MED ORDER — MAGNESIUM SULFATE 2 GM/50ML IV SOLN
2.0000 g | Freq: Once | INTRAVENOUS | Status: AC
  Administered 2015-09-05: 2 g via INTRAVENOUS
  Filled 2015-09-05: qty 50

## 2015-09-05 MED ORDER — ACETAMINOPHEN 325 MG PO TABS
650.0000 mg | ORAL_TABLET | Freq: Once | ORAL | Status: AC | PRN
  Administered 2015-09-05: 650 mg via ORAL

## 2015-09-05 MED ORDER — HEPARIN SODIUM (PORCINE) 5000 UNIT/ML IJ SOLN
5000.0000 [IU] | Freq: Three times a day (TID) | INTRAMUSCULAR | Status: DC
  Administered 2015-09-05 – 2015-09-06 (×2): 5000 [IU] via SUBCUTANEOUS
  Filled 2015-09-05 (×5): qty 1

## 2015-09-05 MED ORDER — PROPOFOL 1000 MG/100ML IV EMUL
INTRAVENOUS | Status: AC
  Filled 2015-09-05: qty 100

## 2015-09-05 MED ORDER — ETOMIDATE 2 MG/ML IV SOLN
INTRAVENOUS | Status: AC | PRN
  Administered 2015-09-05: 20 mg via INTRAVENOUS

## 2015-09-05 MED ORDER — POTASSIUM PHOSPHATES 15 MMOLE/5ML IV SOLN
40.0000 meq | Freq: Once | INTRAVENOUS | Status: DC
  Filled 2015-09-05: qty 9.09

## 2015-09-05 NOTE — ED Notes (Signed)
Preparing to intubate.  

## 2015-09-05 NOTE — ED Provider Notes (Addendum)
CSN: [id number]     Arrival date & time 09/05/15  1502 History   First MD Initiated Contact with Patient 09/05/15 1558     Chief Complaint  Patient presents with  . Hemoptysis     (Consider location/radiation/quality/duration/timing/severity/associated sxs/prior Treatment) HPI  35 year old male presents with hemoptysis. This started last night. Over the last 5 days he's been having vomiting, diarrhea, body aches, cough, and headache. Cough became bloody last night. He states the sputum is a most completely blood. Patient denies any shortness of breath. He has not felt much better since being prescribed antibiotics yesterday in the emergency department. Patient is still having diarrhea. He denies any chest pain or pleuritic pain. Patient denies any history of cancer, hypertension, hyperlipidemia, recent incarceration, or recent travel. Denies a history or chance he has HIV  History reviewed. No pertinent past medical history. History reviewed. No pertinent past surgical history. History reviewed. No pertinent family history. Social History  Substance Use Topics  . Smoking status: None  . Smokeless tobacco: None  . Alcohol Use: None    Review of Systems  Constitutional: Positive for fever.  Respiratory: Positive for cough. Negative for shortness of breath.   Cardiovascular: Negative for chest pain.  Gastrointestinal: Positive for vomiting and diarrhea.  Neurological: Positive for headaches.  All other systems reviewed and are negative.     Allergies  Penicillins  Home Medications   Prior to Admission medications   Medication Sig Start Date End Date Taking? Authorizing Provider  azithromycin (ZITHROMAX) 250 MG tablet Take 1 tablet (250 mg total) by mouth daily. Take first 2 tablets together, then 1 every day until finished. 09/05/15  Yes Shawn C Joy, PA-C  benzonatate (TESSALON) 100 MG capsule Take 1 capsule (100 mg total) by mouth every 8 (eight) hours. 09/04/15  Yes Shawn C Joy,  PA-C  dicyclomine (BENTYL) 20 MG tablet Take 1 tablet (20 mg total) by mouth 2 (two) times daily. 09/04/15  Yes Shawn C Joy, PA-C  ondansetron (ZOFRAN ODT) 4 MG disintegrating tablet Take 1 tablet (4 mg total) by mouth every 8 (eight) hours as needed for nausea or vomiting. 09/04/15  Yes Shawn C Joy, PA-C   BP 130/100 mmHg  Pulse 115  Temp(Src) 102.9 F (39.4 C) (Oral)  Resp 22  SpO2 95% Physical Exam  Constitutional: He is oriented to person, place, and time. He appears well-developed and well-nourished.  HENT:  Head: Normocephalic and atraumatic.  Right Ear: External ear normal.  Left Ear: External ear normal.  Nose: Nose normal.  Eyes: Right eye exhibits no discharge. Left eye exhibits no discharge.  Neck: Neck supple.  Cardiovascular: Regular rhythm, normal heart sounds and intact distal pulses.  Tachycardia present.   Pulmonary/Chest: Effort normal. He has decreased breath sounds in the right upper field, the right middle field, the left upper field, the left middle field and the left lower field.  Abdominal: Soft. There is no tenderness.  Musculoskeletal: He exhibits no edema.  Neurological: He is alert and oriented to person, place, and time.  Skin: Skin is warm and dry.  Nursing note and vitals reviewed.   ED Course  .Intubation Date/Time: 09/05/2015 7:50 PM Performed by: Pricilla Loveless Authorized by: Pricilla Loveless Consent: Verbal consent obtained. Risks and benefits: risks, benefits and alternatives were discussed Consent given by: patient Indications: respiratory distress and  respiratory failure Intubation method: video-assisted Patient status: paralyzed (RSI) Preoxygenation: nonrebreather mask Sedatives: etomidate Paralytic: rocuronium Tube size: 7.5 mm Tube type: cuffed Number of  attempts: 1 Cricoid pressure: no Cords visualized: yes Post-procedure assessment: chest rise and CO2 detector Breath sounds: equal Cuff inflated: yes ETT to lip: 24 cm Tube  secured with: ETT holder Chest x-ray interpreted by me and radiologist. Chest x-ray findings: endotracheal tube in appropriate position Patient tolerance: Patient tolerated the procedure well with no immediate complications   (including critical care time) Labs Review Labs Reviewed  COMPREHENSIVE METABOLIC PANEL - Abnormal; Notable for the following:    Sodium 133 (*)    Potassium 3.4 (*)    Chloride 99 (*)    CO2 21 (*)    Glucose, Bld 173 (*)    Creatinine, Ser 1.80 (*)    Calcium 7.8 (*)    Total Protein 5.4 (*)    Albumin 2.9 (*)    AST 69 (*)    GFR calc non Af Amer 47 (*)    GFR calc Af Amer 55 (*)    All other components within normal limits  CBC WITH DIFFERENTIAL/PLATELET - Abnormal; Notable for the following:    WBC 1.5 (*)    Platelets 90 (*)    Neutro Abs 1.0 (*)    Lymphs Abs 0.3 (*)    All other components within normal limits  I-STAT CG4 LACTIC ACID, ED - Abnormal; Notable for the following:    Lactic Acid, Venous 5.79 (*)    All other components within normal limits  CULTURE, BLOOD (ROUTINE X 2)  CULTURE, BLOOD (ROUTINE X 2)  URINE CULTURE  URINALYSIS, ROUTINE W REFLEX MICROSCOPIC (NOT AT [facility])    Imaging Review Dg Chest 2 View  09/04/2015  CLINICAL DATA:  Weakness and cough.  Fever EXAM: CHEST  2 VIEW COMPARISON:  None FINDINGS: Patchy density in the left base is noted, suspicious for pneumonia. Right lung is clear. No pleural effusion or edema. No airspace consolidation. IMPRESSION: 1. Left base opacity suspicious for pneumonia. Electronically Signed   By: Signa Kellaylor  Stroud M.D.   On: 09/04/2015 17:00   Dg Chest Portable 1 View  09/05/2015  CLINICAL DATA:  35 year old male status post intubation.  Pneumonia. EXAM: PORTABLE CHEST 1 VIEW COMPARISON:  Chest x-ray 09/05/2015 at 18:24. FINDINGS: An endotracheal tube is in place with tip 4.3 cm above the carina. A nasogastric tube is seen extending into the stomach, however, the tip of the nasogastric tube extends  below the lower margin of the image. Lung volumes are normal. Patchy multifocal airspace disease asymmetrically distributed throughout the lungs, most confluent throughout the left mid to lower lung, but also very confluent in the right mid lung. Relative sparing of the right base and right apex. No definite pleural effusions. No evidence of pulmonary edema. Heart size is normal. The patient is rotated to the right on today's exam, resulting in distortion of the mediastinal contours and reduced diagnostic sensitivity and specificity for mediastinal pathology. IMPRESSION: 1. Support apparatus, as above. 2. Patchy multifocal asymmetrically distributed airspace disease favored to reflect multilobar pneumonia. Electronically Signed   By: Trudie Reedaniel  Entrikin M.D.   On: 09/05/2015 20:12   Dg Chest Portable 1 View  09/05/2015  CLINICAL DATA:  Worsening dyspnea. Recent diagnosis of pneumonia, now with hemoptysis. EXAM: PORTABLE CHEST 1 VIEW COMPARISON:  Radiograph earlier this day at 1613 hour, radiographs yesterday and 1634 hour FINDINGS: Increased density of the bilateral multifocal opacities involving the right perihilar lung, left mid upper and lower lung zones. The heart size is normal. No evident pleural effusion or pneumothorax. No acute osseous abnormalities. IMPRESSION:  Increased density of bilateral multifocal opacities from exam 2 hours prior, and rather extensive progression from exam performed yesterday. While multifocal pneumonia could have this appearance, a component of pulmonary edema or ARDS is considered. Electronically Signed   By: Rubye Oaks M.D.   On: 09/05/2015 18:58   Dg Chest Port 1 View  09/05/2015  CLINICAL DATA:  35 year old male with sepsis, fever and cough. EXAM: PORTABLE CHEST 1 VIEW COMPARISON:  09/04/2015. FINDINGS: The cardiomediastinal silhouette is unremarkable. New airspace disease within the mid right lung and throughout the majority of the left lung noted, compatible with multi  focal pneumonia. There is no evidence of pleural effusion or pneumothorax. No acute bony abnormalities are identified. IMPRESSION: New bilateral airspace disease, left-greater-than-right, compatible with bilateral multi focal pneumonia. Electronically Signed   By: Harmon Pier M.D.   On: 09/05/2015 16:22   I have personally reviewed and evaluated these images and lab results as part of my medical decision-making.   EKG Interpretation None      CRITICAL CARE Performed by: Pricilla Loveless T   Total critical care time: 75 minutes  Critical care time was exclusive of separately billable procedures and treating other patients.  Critical care was necessary to treat or prevent imminent or life-threatening deterioration.  Critical care was time spent personally by me on the following activities: development of treatment plan with patient and/or surrogate as well as nursing, discussions with consultants, evaluation of patient's response to treatment, examination of patient, obtaining history from patient or surrogate, ordering and performing treatments and interventions, ordering and review of laboratory studies, ordering and review of radiographic studies, pulse oximetry and re-evaluation of patient's condition.  MDM   Final diagnoses:  Acute respiratory failure with hypoxia (HCC)  Community acquired pneumonia  Lactic acidosis    Patient with significantly worse pneumonia on chest x-ray shortly after arrival. On initial arrival he is febrile and somewhat tachypnea but is talking in complete sentences and does not appear in significant distress. Initial lactate is over 5. Sepsis protocol started and he was given 30 mL/KG of IV fluids. After initial workup, ICU consulted. Had multiple discussions with the ICU attending on call, Dr. Marchelle Gearing. Initially plan was to do a third liter of fluid given his lack of comorbidities and previous health. However after the second liter the patient has become  progressive more short of breath. Patient still protecting airway but has become more uncomfortable. Throughout his ED stay he was going to be tried on BiPAP but he is continuously spitting up hemoptysis and thus would not tolerated. He has progressively worsened to the point that he was intubated for airway protection. Patient has not been hypotensive. He was given further aggressive IV fluids. He was initially given very broad IV antibiotics for community acquired pneumonia. Patient is critically ill and will need to be admitted to the ICU. Family updated on course of care.    Pricilla Loveless, MD 09/05/15 1610  Pricilla Loveless, MD 09/06/15 [phone]

## 2015-09-05 NOTE — ED Notes (Signed)
Pt unable to be on bipap d/t coughing up blood. Plan to intubate pt. MD has spoken to pt about plan. Pt verbally consents for procedure.

## 2015-09-05 NOTE — H&P (Signed)
PULMONARY / CRITICAL CARE MEDICINE   Name: Matthew [name] MRN: [id number] DOB: 04/09/1981    ADMISSION DATE:  09/05/2015 CONSULTATION DATE:  09/05/15  REFERRING MD :  ED Physician  PRIMARY SERVICE: Critical Care   CHIEF COMPLAINT: SOB, cough, N/V  HISTORY OF PRESENT ILLNESS:  Patient is a 35 year old male with no significant past medical history presenting [facility] room complaining of dyspnea for 1 week and coughing up blood since yesterday. Patient had increased work of breathing and was coughing up blood. He could barely speak. Remaining history was obtained from emergency room documentation. As per emergency room documentation, he reported not feeling any better despite taking antibiotics for recently diagnosed pneumonia. He was found to have abnormal lactic acid (5.79) and code sepsis was called. Patient was found to have temperature 102.9, HR 115, RR 22, white blood cell 1.5, and serum creatinine 1.8. He was started on Vanco and Zosyn in the ED for coverage of pneumococcal community-acquired pneumonia. Patient had increased work of breathing and was coughing up blood, as such, he was intubated in the ED  [facility] yesterday 09/04/2015 with body aches, nausea, abdominal pain, subjective fever, and headache for the last 4 days. He endorsed vomiting during the first 2 days of his illness. Denied having any sick contacts. Reported having abdominal pain which is periumbilical,  10/10 intensity, "feels like a knot," nonradiating. Reported having headache which was bilateral, "all over," rated 10/10, throbbing, nonradiating. Patient had not taken any medications. He denied chills, diarrhea/constipation, chest pain, shortness of breath, or any pain or complaints. He later reported to the physician there that he was having nonproductive cough over the last 2-3 days.Patient was deemed to have possible viral illness and discharged the same day.   Marland Kitchen.  PAST  MEDICAL HISTORY :  History reviewed. No pertinent past medical history. History reviewed. No pertinent past surgical history. Prior to Admission medications   Medication Sig Start Date End Date Taking? Authorizing Provider  azithromycin (ZITHROMAX) 250 MG tablet Take 1 tablet (250 mg total) by mouth daily. Take first 2 tablets together, then 1 every day until finished. 09/05/15  Yes Shawn C Joy, PA-C  benzonatate (TESSALON) 100 MG capsule Take 1 capsule (100 mg total) by mouth every 8 (eight) hours. 09/04/15  Yes Shawn C Joy, PA-C  dicyclomine (BENTYL) 20 MG tablet Take 1 tablet (20 mg total) by mouth 2 (two) times daily. 09/04/15  Yes Shawn C Joy, PA-C  ondansetron (ZOFRAN ODT) 4 MG disintegrating tablet Take 1 tablet (4 mg total) by mouth every 8 (eight) hours as needed for nausea or vomiting. 09/04/15  Yes Shawn C Joy, PA-C   Allergies  Allergen Reactions  . Penicillins Other (See Comments)    Unknown reaction as a child. Has patient had a PCN reaction causing immediate rash, facial/tongue/throat swelling, SOB or lightheadedness with hypotension: unknown Has patient had a PCN reaction causing severe rash involving mucus membranes or skin necrosis: unknown Has patient had a PCN reaction that required hospitalization: unknown Has patient had a PCN reaction occurring within the last 10 years: Yes If all of the above answers are "NO", then may proceed with Cephalosporin use.     FAMILY HISTORY:  History reviewed. No pertinent family history. SOCIAL HISTORY:  has no tobacco, alcohol, and drug history on file.  REVIEW OF SYSTEMS:  Same as history of present illness. No further history could be obtained as patient was in significant respiratory  distress.  SUBJECTIVE:   VITAL SIGNS: Temp:  [99.9 F (37.7 C)-103 F (39.4 C)] 103 F (39.4 C) (01/09 2010) Pulse Rate:  [107-156] 129 (01/09 2015) Resp:  [16-46] 26 (01/09 2015) BP: (96-144)/(59-100) 110/59 mmHg (01/09 2015) SpO2:  [86 %-100 %]  100 % (01/09 2015) Weight:  [56.7 kg (125 lb)] 56.7 kg (125 lb) (01/09 1657) HEMODYNAMICS:   VENTILATOR SETTINGS:   INTAKE / OUTPUT: Intake/Output      01/09 0701 - 01/10 0700   I.V. (mL/kg) 2350 (41.4)   Total Intake(mL/kg) 2350 (41.4)   Urine (mL/kg/hr) 100   Total Output 100   Net +2250         PHYSICAL EXAMINATION: General:  Patient is sitting up in bed coughing blood, appears to be in significant respiratory distress, not able to speak in full sentences Neuro:  Awake, alert Rest of the physical exam was deferred as patient was in significant respiratory distress and emergent intubation was indicated.  LABS:  CBC  Recent Labs Lab 09/05/15 1541 09/05/15 1821 09/05/15 1926  WBC 1.5* 1.2* 0.9*  HGB 15.8 13.5 14.7  HCT 45.5 39.1 41.8  PLT 90* 70* 85*   Coag's  Recent Labs Lab 09/05/15 1926  APTT 37  INR 1.47   BMET  Recent Labs Lab 09/05/15 1541 09/05/15 1821 09/05/15 1926  NA 133* 133* 137  K 3.4* 3.1* 3.9  CL 99* 102 106  CO2 21* 16* 17*  BUN 8 8 8   CREATININE 1.80* 1.65* 1.60*  GLUCOSE 173* 141* 114*   Electrolytes  Recent Labs Lab 09/05/15 1541 09/05/15 1821 09/05/15 1926  CALCIUM 7.8* 6.6* 6.8*  MG  --   --  1.3*  PHOS  --   --  2.0*   Sepsis Markers  Recent Labs Lab 09/05/15 1544 09/05/15 1817 09/05/15 1926  LATICACIDVEN 5.79* 6.42* 6.8*   ABG No results for input(s): PHART, PCO2ART, PO2ART in the last 168 hours. Liver Enzymes  Recent Labs Lab 09/04/15 1601 09/05/15 1541 09/05/15 1926  AST 37 69* 75*  ALT 21 <5* <5*  ALKPHOS 65 42 35*  BILITOT 0.7 0.6 1.4*  ALBUMIN 3.9 2.9* 2.4*   Cardiac Enzymes  Recent Labs Lab 09/05/15 1926  TROPONINI 0.07*   Glucose No results for input(s): GLUCAP in the last 168 hours.  Imaging Dg Chest 2 View  09/04/2015  CLINICAL DATA:  Weakness and cough.  Fever EXAM: CHEST  2 VIEW COMPARISON:  None FINDINGS: Patchy density in the left base is noted, suspicious for pneumonia. Right  lung is clear. No pleural effusion or edema. No airspace consolidation. IMPRESSION: 1. Left base opacity suspicious for pneumonia. Electronically Signed   By: Signa Kell M.D.   On: 09/04/2015 17:00   Dg Chest Portable 1 View  09/05/2015  CLINICAL DATA:  35 year old male status post intubation.  Pneumonia. EXAM: PORTABLE CHEST 1 VIEW COMPARISON:  Chest x-ray 09/05/2015 at 18:24. FINDINGS: An endotracheal tube is in place with tip 4.3 cm above the carina. A nasogastric tube is seen extending into the stomach, however, the tip of the nasogastric tube extends below the lower margin of the image. Lung volumes are normal. Patchy multifocal airspace disease asymmetrically distributed throughout the lungs, most confluent throughout the left mid to lower lung, but also very confluent in the right mid lung. Relative sparing of the right base and right apex. No definite pleural effusions. No evidence of pulmonary edema. Heart size is normal. The patient is rotated to the right on  today's exam, resulting in distortion of the mediastinal contours and reduced diagnostic sensitivity and specificity for mediastinal pathology. IMPRESSION: 1. Support apparatus, as above. 2. Patchy multifocal asymmetrically distributed airspace disease favored to reflect multilobar pneumonia. Electronically Signed   By: Trudie Reed M.D.   On: 09/05/2015 20:12   Dg Chest Portable 1 View  09/05/2015  CLINICAL DATA:  Worsening dyspnea. Recent diagnosis of pneumonia, now with hemoptysis. EXAM: PORTABLE CHEST 1 VIEW COMPARISON:  Radiograph earlier this day at 1613 hour, radiographs yesterday and 1634 hour FINDINGS: Increased density of the bilateral multifocal opacities involving the right perihilar lung, left mid upper and lower lung zones. The heart size is normal. No evident pleural effusion or pneumothorax. No acute osseous abnormalities. IMPRESSION: Increased density of bilateral multifocal opacities from exam 2 hours prior, and rather  extensive progression from exam performed yesterday. While multifocal pneumonia could have this appearance, a component of pulmonary edema or ARDS is considered. Electronically Signed   By: Rubye Oaks M.D.   On: 09/05/2015 18:58   Dg Chest Port 1 View  09/05/2015  CLINICAL DATA:  35 year old male with sepsis, fever and cough. EXAM: PORTABLE CHEST 1 VIEW COMPARISON:  09/04/2015. FINDINGS: The cardiomediastinal silhouette is unremarkable. New airspace disease within the mid right lung and throughout the majority of the left lung noted, compatible with multi focal pneumonia. There is no evidence of pleural effusion or pneumothorax. No acute bony abnormalities are identified. IMPRESSION: New bilateral airspace disease, left-greater-than-right, compatible with bilateral multi focal pneumonia. Electronically Signed   By: Harmon Pier M.D.   On: 09/05/2015 16:22     CXR:  09/04/15: Left base opacity suspicious for pneumonia. 09/05/15: New bilateral airspace disease, left greater than right, compatible with bilateral multifocal pneumonia. Repeat chest x-ray 2 hours later showing increased density of bilateral multifocal opacities from prior exam. A component of pulmonary edema or ARDS is considered.  ASSESSMENT / PLAN:  PULMONARY A: Severe sepsis secondary to bilateral multifocal pneumonia and possible ARDS. Lactic acid 5.79 admission and then increased to 6.8. ABG showing pH 7.18, PCO2 39.3, PO2 205. Bicarbonate 21. D-dimer elevated at 6.46. Patient was found to have temperature 102.9, HR 115, and RR 22 on admission. Blood pressure was 130/100 on admission dropped to 77/57. Patient has already received 3 L IV fluid boluses. P:   -Normal saline at 125 mL per hour -Levophed to keep MAP greater than 65 -IV Vancomycin per pharmacy  -IV Levaquin 750 mg every 48 hours  -Tylenol 650 mg by mouth as needed for fever -Patient is currently on vent, wean as tolerated -Continue fentanyl infusion and PRN  fentanyl -Continue Versed prn agitation -Droplet precaution -Influenza panel still pending; treat empirically with Tamiflu. -Urine strep pneumo and Legionella antigen pending -Follow-up a.m. CBC -Pending blood culture x2 -Pending urine culture -Follow-up HIV antibody -ANCA, double-stranded DNA, ANA pending  CARDIOVASCULAR A: Tachycardia Elevated troponin - 0.07 likely due to demand ischemia P:  -Continue IV fluid resuscitation -Trend troponin  RENAL A:  Hypomagnesemia - mag 1.3 Hypokalemia - potassium 3.4 Hypophosphatemia - phos 2.0 AKI - Scr 1.8 P:   -Magnesium 2 g IV -Potassium phosphate 40 mEq IV -Follow-up a.m. BMP  GASTROINTESTINAL A:  GI prophylaxis P:   -IV Protonix 40 mg daily  HEMATOLOGIC A:  Leukopenia - white count 1.5. Differential showing low neutrophils 0.7 and low lymphocytes 0.4. Thrombocytopenia - platelets 90 DVT prophylaxis P:  -Heparin 5000 units subcutaneous every 8 hours for DVT prophylaxis -Follow-up a.m. CBC  INFECTIOUS A:  Severe sepsis secondary to bilateral multifocal pneumonia  P:   -IV Vancomycin per pharmacy  -IV Levaquin 750 mg every 48 hours  -Droplet precaution -Influenza panel still pending; treat empirically with Tamiflu. -Urine strep pneumo and Legionella antigen pending -Follow-up a.m. CBC -Pending blood culture x2 -Pending urine culture -Follow-up HIV antibody   ENDOCRINE A:  CBG 114 P:   -Continue to monitor  NEUROLOGIC A:  No active issue prior to sedation. P:   RASS of 0 to -2. Versed/fentnyl for sedation.  I have personally obtained a history, examined the patient, evaluated laboratory and imaging results, formulated the assessment and plan and placed orders. CRITICAL CARE: The patient is critically ill with multiple organ systems failure and requires high complexity decision making for assessment and support, frequent evaluation and titration of therapies, application of advanced monitoring technologies and  extensive interpretation of multiple databases. Critical Care Time devoted to patient care services described in this note is minutes.     09/05/2015, 42:56 PM  35 year old with history of THC and ETOH abuse who presents to the hospital with SOB and hemoptysis.  In the ED, the patient deteriorated rapidly and developed acute hypoxic respiratory failure and decision was made to intubate patient.  After intubation, patient progressed to ARDS.  On exam crackles L>R and on CXR I reviewed myself revealed L>R pulmonary infiltrate.  Will decrease PEEP to 10 and maintain FiO2 for sat of 92-95%.  Levaquin, vanc and tamiflu as ordered.  Will bronch today for cultures and evaluate site of bleeding.  Will place central line for BP support via levophed.  Change propofol to versed/fentanyl.  Bone marrow suppression with low WBC and platelet.  Neutropenic precaution.  Address electrolytes as ordered.  F/U labs.  Likely start diet in AM.  If worsens then will need to speak about ECMO, but improving for now so will hold off.  Brother and cousin updated at length bedside.  The patient is critically ill with multiple organ systems failure and requires high complexity decision making for assessment and support, frequent evaluation and titration of therapies, application of advanced monitoring technologies and extensive interpretation of multiple databases.   Critical Care Time devoted to patient care services described in this note is  45  Minutes. This time reflects time of care of this signee Dr Koren Bound. This critical care time does not reflect procedure time, or teaching time or supervisory time of PA/NP/Med student/Med Resident etc but could involve care discussion time.  Alyson Reedy, M.D. [facility] Pulmonary/Critical Care Medicine. Pager: [phone]. After hours pager: [phone].

## 2015-09-05 NOTE — ED Notes (Signed)
edp goldston aware of abnormal lactic acid, code sepsis called, pt roomed in ED

## 2015-09-05 NOTE — Progress Notes (Addendum)
ANTIBIOTIC CONSULT NOTE - INITIAL  Pharmacy Consult for Vancomycin and Levaquin Indication: pneumonia  Allergies  Allergen Reactions  . Penicillins Other (See Comments)    Unknown reaction as a child. Has patient had a PCN reaction causing immediate rash, facial/tongue/throat swelling, SOB or lightheadedness with hypotension: unknown Has patient had a PCN reaction causing severe rash involving mucus membranes or skin necrosis: unknown Has patient had a PCN reaction that required hospitalization: unknown Has patient had a PCN reaction occurring within the last 10 years: Yes If all of the above answers are "NO", then may proceed with Cephalosporin use.     Patient Measurements: Total Body Wt: 56.8 (125 lbs) (pt reported) Ideal Body Wt (estimated): 56.9 Height (estimated): 5'3"  Vital Signs: Temp: 102.9 F (39.4 C) (01/09 1532) Temp Source: Oral (01/09 1532) BP: 130/100 mmHg (01/09 1532) Pulse Rate: 115 (01/09 1532) Intake/Output from previous day:   Intake/Output from this shift:    Labs:  Recent Labs  09/04/15 1601 09/05/15 1541  WBC 4.3 1.5*  HGB 16.0 15.8  PLT 142* 90*  CREATININE 1.29*  --    CrCl cannot be calculated (Unknown ideal weight.). No results for input(s): VANCOTROUGH, VANCOPEAK, VANCORANDOM, GENTTROUGH, GENTPEAK, GENTRANDOM, TOBRATROUGH, TOBRAPEAK, TOBRARND, AMIKACINPEAK, AMIKACINTROU, AMIKACIN in the last 72 hours.   Microbiology: Recent Results (from the past 720 hour(s))  Rapid strep screen     Status: None   Collection Time: 09/04/15  3:54 PM  Result Value Ref Range Status   Streptococcus, Group A Screen (Direct) NEGATIVE NEGATIVE Final    Comment: (NOTE) A Rapid Antigen test may result negative if the antigen level in the sample is below the detection level of this test. The FDA has not cleared this test as a stand-alone test therefore the rapid antigen negative result has reflexed to a Group A Strep culture.     Medical  History: History reviewed. No pertinent past medical history.  Medications:   (Not in a hospital admission) Scheduled:  . acetaminophen       Infusions:  . levofloxacin (LEVAQUIN) IV 750 mg (09/05/15 1630)  . sodium chloride 1,000 mL (09/05/15 1626)  . vancomycin     Assessment: 35yo male presented to ED w/ body aches, nausea, abdominal pain, subjective fever, and headache for last 4 days. Endorsed vomiting during first 2 days of illness. Pharmacy consulted to dose Vancomycin and Levaquin for PNA. Temp 102.9, WBC 1.5, sCr 1.8  Pt has already received fist dose of Vancomycin 1000mg  IV in ED.  Goal of Therapy:  Vancomycin trough level 15-20 mcg/ml Eradication of infection  Plan:  Vancomycin 500mg  q12h Levaquin 750mg  IV q48 Measure antibiotic drug levels at steady state Follow up culture results, pt weight and height, clinical course.  Charolette ChildHammer, Ethan 09/05/2015,4:28 PM   Pharmacy Code Sepsis Protocol  Time of code sepsis page: 1607 Time of antibiotic delivery: 1659  Were antibiotics ordered at the time of the code sepsis page? Yes Was it required to contact the physician? []  Physician not contacted []  Physician contacted to order antibiotics for code sepsis []  Physician contacted to recommend changing antibiotics   Nurse education provided: [x]  Minutes left to administer antibiotics to achieve 1 hour goal []  Correct order of antibiotic administration []  Antibiotic Y-site compatibilities     Fredrik RiggerMarkle, Avrohom Mckelvin Sue, PharmD 09/05/2015, 5:45 PM

## 2015-09-05 NOTE — ED Notes (Signed)
Pt placed on neutropenic precautions

## 2015-09-05 NOTE — Code Documentation (Signed)
Pt intubated successfully. Good color change, breath sounds present bilaterally

## 2015-09-05 NOTE — ED Notes (Signed)
Pt was recently diagnosed with pneumonia. Reports now coughing up blood and not feeling any better despite antibiotics. Airway intact at triage.

## 2015-09-05 NOTE — ED Notes (Signed)
Critical care at bedside  

## 2015-09-05 NOTE — Progress Notes (Signed)
  Call from Dr Criss AlvineGoldston  - sounds like pneumococcal cap   ICU CAP Admission Criteria 09/05/2015 D/w dr Criss Alvinegoldston  Minor Criteria   RR >/= 30 or need for mechanical ventilation RR 22  PF ratio </= 250 Pulse ox 93% RA  Multilobar infiltrates yes  Confusion/Disorientation/Acute delirium - hypo/hyper active normal  WC </= 4K yes  Platelet count </= 100K yes  Hypothermia <36C no  Hypotension (even needing lot of fluids) No,  Total Score for Minor Criteria   MAJOR CRITERIA   Septic shock - need for vasopressors no  Mechanical Ventilation (even non-invasive) no  Total score for Major Criteria    Admit ICU if 3 minor or 1 major (Level 2 ATS rec) Meets 3 minor criterial         CURB-65 Admission Decision Scoring, Level 1 Rec Score Patient  Confusion / Delirium 1 0  Uremia - BUN >/= 20mg % 1 0  Respiratory Rate >/= 30/min 1 0  Blood pressure </= 90sbp, or diastolic </= 60 1 0  Age >/= 65 1 0  Score total (0-1 opd rx, 2 = admit, >/= 3 ?ICU) 6 0  30d mort 0=0.7%, 1=2%, 2=9%, 3=14%, 4=40%, 5=57%      Based on above - REC 1. Aggressive fluids -> recheck lactate around 6pm  so after 3L fluid bolus in total.  -> also recheck bmet, cbc 2. If clinical and lab trend at recheck shows improvement -> SDU under triad. If not, ICU under PCCM 3. Agre with vanc and levaquin.  Dr. Kalman ShanMurali Ileta Ofarrell, M.D., [facility].P Pulmonary and Critical Care Medicine Staff Physician [place] [place] [facility] Pager: [phone], If no answer or between  15:00h - 7:00h: call [phone]  09/05/2015 5:06 PM

## 2015-09-05 NOTE — Progress Notes (Signed)
eLink Physician-Brief Progress Note Patient Name: Matthew [name] DOB: Jan 01, 1981 MRN: [id number]   Date of Service  09/05/2015  HPI/Events of Note  cxr lot worse Now needing 3L o2 Lactate worse   Recent Labs Lab 09/05/15 1544 09/05/15 1817  LATICACIDVEN 5.79* 6.42*     eICU Interventions  bipap Admit icu on pccm     Intervention Category Major Interventions: Respiratory failure - evaluation and management  [name] 09/05/2015, 6:29 PM

## 2015-09-05 NOTE — ED Notes (Signed)
Pt responsive to RN. Pt comfortable at this time. Denies pain.

## 2015-09-05 NOTE — ED Notes (Signed)
Pt continues to deteriorate. Pt alert and oriented but restless. MD updated.

## 2015-09-06 ENCOUNTER — Inpatient Hospital Stay (HOSPITAL_COMMUNITY): Payer: Medicaid Other | Admitting: Certified Registered Nurse Anesthetist

## 2015-09-06 ENCOUNTER — Inpatient Hospital Stay (HOSPITAL_COMMUNITY): Payer: Medicaid Other

## 2015-09-06 ENCOUNTER — Encounter (HOSPITAL_COMMUNITY)
Admission: EM | Disposition: A | Discharge status: Other Acute Inpatient Hospital | Payer: Self-pay | Source: Home / Self Care | Attending: Pulmonary Disease

## 2015-09-06 ENCOUNTER — Other Ambulatory Visit: Payer: Self-pay | Admitting: Internal Medicine

## 2015-09-06 DIAGNOSIS — E872 Acidosis, unspecified: Secondary | ICD-10-CM | POA: Insufficient documentation

## 2015-09-06 DIAGNOSIS — J80 Acute respiratory distress syndrome: Secondary | ICD-10-CM

## 2015-09-06 DIAGNOSIS — J9600 Acute respiratory failure, unspecified whether with hypoxia or hypercapnia: Secondary | ICD-10-CM

## 2015-09-06 DIAGNOSIS — J9601 Acute respiratory failure with hypoxia: Secondary | ICD-10-CM | POA: Insufficient documentation

## 2015-09-06 DIAGNOSIS — N179 Acute kidney failure, unspecified: Secondary | ICD-10-CM

## 2015-09-06 DIAGNOSIS — J189 Pneumonia, unspecified organism: Secondary | ICD-10-CM | POA: Insufficient documentation

## 2015-09-06 DIAGNOSIS — R570 Cardiogenic shock: Secondary | ICD-10-CM

## 2015-09-06 HISTORY — PX: CANNULATION FOR CARDIOPULMONARY BYPASS: SHX6411

## 2015-09-06 HISTORY — PX: INTRAOPERATIVE TRANSESOPHAGEAL ECHOCARDIOGRAM: SHX5062

## 2015-09-06 LAB — BASIC METABOLIC PANEL
ANION GAP: 9 (ref 5–15)
ANION GAP: 8 (ref 5–15)
Anion gap: 8 (ref 5–15)
BUN: 9 mg/dL (ref 6–20)
BUN: 9 mg/dL (ref 6–20)
BUN: 10 mg/dL (ref 6–20)
CALCIUM: 6.1 mg/dL — AB (ref 8.9–10.3)
CHLORIDE: 105 mmol/L (ref 101–111)
CO2: 20 mmol/L — ABNORMAL LOW (ref 22–32)
CO2: 19 mmol/L — ABNORMAL LOW (ref 22–32)
CO2: 19 mmol/L — ABNORMAL LOW (ref 22–32)
CREATININE: 1.43 mg/dL — AB (ref 0.61–1.24)
Calcium: 6.2 mg/dL — CL (ref 8.9–10.3)
Calcium: 6 mg/dL — CL (ref 8.9–10.3)
Chloride: 108 mmol/L (ref 101–111)
Chloride: 109 mmol/L (ref 101–111)
Creatinine, Ser: 1.38 mg/dL — ABNORMAL HIGH (ref 0.61–1.24)
Creatinine, Ser: 1.56 mg/dL — ABNORMAL HIGH (ref 0.61–1.24)
GFR calc Af Amer: >60 mL/min (ref >60)
GFR calc Af Amer: >60 mL/min (ref >60)
GFR calc Af Amer: >60 mL/min (ref >60)
GFR, EST NON AFRICAN AMERICAN: 56 mL/min — AB (ref >60)
GLUCOSE: 171 mg/dL — AB (ref 65–99)
GLUCOSE: 104 mg/dL — AB (ref 65–99)
Glucose, Bld: 124 mg/dL — ABNORMAL HIGH (ref 65–99)
POTASSIUM: 3.7 mmol/L (ref 3.5–5.1)
POTASSIUM: 3.8 mmol/L (ref 3.5–5.1)
Potassium: 3.8 mmol/L (ref 3.5–5.1)
SODIUM: 135 mmol/L (ref 135–145)
SODIUM: 136 mmol/L (ref 135–145)
Sodium: 134 mmol/L — ABNORMAL LOW (ref 135–145)

## 2015-09-06 LAB — POCT I-STAT 3, ART BLOOD GAS (G3+)
ACID-BASE DEFICIT: 13 mmol/L — AB (ref 0.0–2.0)
ACID-BASE DEFICIT: 12 mmol/L — AB (ref 0.0–2.0)
ACID-BASE DEFICIT: 6 mmol/L — AB (ref 0.0–2.0)
Acid-base deficit: 8 mmol/L — ABNORMAL HIGH (ref 0.0–2.0)
Acid-base deficit: 11 mmol/L — ABNORMAL HIGH (ref 0.0–2.0)
Acid-base deficit: 9 mmol/L — ABNORMAL HIGH (ref 0.0–2.0)
BICARBONATE: 15.2 meq/L — AB (ref 20.0–24.0)
BICARBONATE: 13.8 meq/L — AB (ref 20.0–24.0)
BICARBONATE: 17.3 meq/L — AB (ref 20.0–24.0)
Bicarbonate: 17.3 mEq/L — ABNORMAL LOW (ref 20.0–24.0)
Bicarbonate: 14.4 mEq/L — ABNORMAL LOW (ref 20.0–24.0)
Bicarbonate: 14.4 mEq/L — ABNORMAL LOW (ref 20.0–24.0)
O2 SAT: 98 %
O2 SAT: 100 %
O2 SAT: 100 %
O2 SAT: 97 %
O2 Saturation: 88 %
O2 Saturation: 100 %
PCO2 ART: 38.2 mmHg (ref 35.0–45.0)
PH ART: 7.272 — AB (ref 7.350–7.450)
PH ART: 7.215 — AB (ref 7.350–7.450)
PO2 ART: 226 mmHg — AB (ref 80.0–100.0)
PO2 ART: 113 mmHg — AB (ref 80.0–100.0)
PO2 ART: 327 mmHg — AB (ref 80.0–100.0)
Patient temperature: 102.2
TCO2: 18 mmol/L (ref 0–100)
TCO2: 16 mmol/L (ref 0–100)
TCO2: 15 mmol/L (ref 0–100)
TCO2: 15 mmol/L (ref 0–100)
TCO2: 18 mmol/L (ref 0–100)
TCO2: 15 mmol/L (ref 0–100)
pCO2 arterial: 38.5 mmHg (ref 35.0–45.0)
pCO2 arterial: 32.8 mmHg — ABNORMAL LOW (ref 35.0–45.0)
pCO2 arterial: 32.3 mmHg — ABNORMAL LOW (ref 35.0–45.0)
pCO2 arterial: 24.1 mmHg — ABNORMAL LOW (ref 35.0–45.0)
pCO2 arterial: 24.6 mmHg — ABNORMAL LOW (ref 35.0–45.0)
pH, Arterial: 7.232 — ABNORMAL LOW (ref 7.350–7.450)
pH, Arterial: 7.259 — ABNORMAL LOW (ref 7.350–7.450)
pH, Arterial: 7.464 — ABNORMAL HIGH (ref 7.350–7.450)
pH, Arterial: 7.376 (ref 7.350–7.450)
pO2, Arterial: 68 mmHg — ABNORMAL LOW (ref 80.0–100.0)
pO2, Arterial: 191 mmHg — ABNORMAL HIGH (ref 80.0–100.0)
pO2, Arterial: 88 mmHg (ref 80.0–100.0)

## 2015-09-06 LAB — C-REACTIVE PROTEIN: CRP: 20.7 mg/dL — AB (ref <1.0)

## 2015-09-06 LAB — TYPE AND SCREEN
ABO/RH(D): O POS
Antibody Screen: NEGATIVE
Unit division: 0
Unit division: 0
Unit division: 0
Unit division: 0

## 2015-09-06 LAB — SEDIMENTATION RATE: Sed Rate: 0 mm/hr (ref 0–16)

## 2015-09-06 LAB — DIFFERENTIAL
BASOS PCT: 1 %
Basophils Absolute: 0 10*3/uL (ref 0.0–0.1)
EOS PCT: 0 %
Eosinophils Absolute: 0 10*3/uL (ref 0.0–0.7)
LYMPHS PCT: 37 %
Lymphs Abs: 0.5 10*3/uL — ABNORMAL LOW (ref 0.7–4.0)
MONOS PCT: 5 %
Monocytes Absolute: 0.1 10*3/uL (ref 0.1–1.0)
NEUTROS ABS: 0.8 10*3/uL — AB (ref 1.7–7.7)
Neutrophils Relative %: 57 %
WBC Morphology: INCREASED

## 2015-09-06 LAB — URINE CULTURE: Culture: NO GROWTH

## 2015-09-06 LAB — POCT I-STAT, CHEM 8
BUN: 9 mg/dL (ref 6–20)
BUN: 8 mg/dL (ref 6–20)
CALCIUM ION: 0.95 mmol/L — AB (ref 1.12–1.23)
CALCIUM ION: 0.78 mmol/L — AB (ref 1.12–1.23)
CREATININE: 1.3 mg/dL — AB (ref 0.61–1.24)
Chloride: 106 mmol/L (ref 101–111)
Chloride: 102 mmol/L (ref 101–111)
Creatinine, Ser: 1.2 mg/dL (ref 0.61–1.24)
GLUCOSE: 177 mg/dL — AB (ref 65–99)
Glucose, Bld: 203 mg/dL — ABNORMAL HIGH (ref 65–99)
HEMATOCRIT: 38 % — AB (ref 39.0–52.0)
HEMATOCRIT: 33 % — AB (ref 39.0–52.0)
HEMOGLOBIN: 12.9 g/dL — AB (ref 13.0–17.0)
HEMOGLOBIN: 11.2 g/dL — AB (ref 13.0–17.0)
Potassium: 3.6 mmol/L (ref 3.5–5.1)
Potassium: 3 mmol/L — ABNORMAL LOW (ref 3.5–5.1)
SODIUM: 150 mmol/L — AB (ref 135–145)
Sodium: 138 mmol/L (ref 135–145)
TCO2: 14 mmol/L (ref 0–100)
TCO2: 28 mmol/L (ref 0–100)

## 2015-09-06 LAB — BLOOD GAS, ARTERIAL
ACID-BASE DEFICIT: 6.7 mmol/L — AB (ref 0.0–2.0)
Bicarbonate: 18 mEq/L — ABNORMAL LOW (ref 20.0–24.0)
Drawn by: 419771
FIO2: 0.5
LHR: 26 {breaths}/min
MECHVT: 450 mL
O2 SAT: 93.2 %
PCO2 ART: 34.7 mmHg — AB (ref 35.0–45.0)
PEEP: 8 cmH2O
PH ART: 7.335 — AB (ref 7.350–7.450)
PO2 ART: 70.2 mmHg — AB (ref 80.0–100.0)
Patient temperature: 98.6
TCO2: 19.1 mmol/L (ref 0–100)

## 2015-09-06 LAB — CBC
HCT: 38.2 % — ABNORMAL LOW (ref 39.0–52.0)
HEMOGLOBIN: 12.9 g/dL — AB (ref 13.0–17.0)
MCH: 29.5 pg (ref 26.0–34.0)
MCHC: 33.8 g/dL (ref 30.0–36.0)
MCV: 87.4 fL (ref 78.0–100.0)
PLATELETS: 131 10*3/uL — AB (ref 150–400)
RBC: 4.37 MIL/uL (ref 4.22–5.81)
RDW: 13.1 % (ref 11.5–15.5)
WBC: 1.4 10*3/uL — AB (ref 4.0–10.5)

## 2015-09-06 LAB — INFLUENZA PANEL BY PCR (TYPE A & B)
H1N1 flu by pcr: NOT DETECTED
INFLAPCR: NEGATIVE
Influenza B By PCR: NEGATIVE

## 2015-09-06 LAB — RAPID URINE DRUG SCREEN, HOSP PERFORMED
Amphetamines: NOT DETECTED
BARBITURATES: NOT DETECTED
Benzodiazepines: POSITIVE — AB
Cocaine: NOT DETECTED
Opiates: NOT DETECTED
Tetrahydrocannabinol: POSITIVE — AB

## 2015-09-06 LAB — GLUCOSE, CAPILLARY
GLUCOSE-CAPILLARY: 108 mg/dL — AB (ref 65–99)
GLUCOSE-CAPILLARY: 155 mg/dL — AB (ref 65–99)
GLUCOSE-CAPILLARY: 70 mg/dL (ref 65–99)
Glucose-Capillary: 114 mg/dL — ABNORMAL HIGH (ref 65–99)
Glucose-Capillary: 152 mg/dL — ABNORMAL HIGH (ref 65–99)

## 2015-09-06 LAB — MRSA PCR SCREENING: MRSA BY PCR: POSITIVE — AB

## 2015-09-06 LAB — TROPONIN I
TROPONIN I: 1.4 ng/mL — AB (ref <0.031)
Troponin I: 0.15 ng/mL — ABNORMAL HIGH (ref <0.031)
Troponin I: 26.62 ng/mL (ref <0.031)

## 2015-09-06 LAB — PHOSPHORUS
PHOSPHORUS: 3.3 mg/dL (ref 2.5–4.6)
Phosphorus: 3 mg/dL (ref 2.5–4.6)

## 2015-09-06 LAB — HIV ANTIBODY (ROUTINE TESTING W REFLEX): HIV Screen 4th Generation wRfx: NONREACTIVE

## 2015-09-06 LAB — MAGNESIUM
MAGNESIUM: 1.8 mg/dL (ref 1.7–2.4)
MAGNESIUM: 1.8 mg/dL (ref 1.7–2.4)

## 2015-09-06 LAB — PREPARE RBC (CROSSMATCH)

## 2015-09-06 LAB — CULTURE, GROUP A STREP: STREP A CULTURE: NEGATIVE

## 2015-09-06 LAB — STREP PNEUMONIAE URINARY ANTIGEN: STREP PNEUMO URINARY ANTIGEN: NEGATIVE

## 2015-09-06 LAB — LACTIC ACID, PLASMA
LACTIC ACID, VENOUS: 3.4 mmol/L — AB (ref 0.5–2.0)
Lactic Acid, Venous: 3 mmol/L (ref 0.5–2.0)
Lactic Acid, Venous: 3 mmol/L (ref 0.5–2.0)

## 2015-09-06 LAB — PATHOLOGIST SMEAR REVIEW

## 2015-09-06 SURGERY — CANNULATION FOR CARDIOPULMONARY BYPASS
Anesthesia: General

## 2015-09-06 MED ORDER — EPINEPHRINE HCL 1 MG/ML IJ SOLN
0.5000 ug/min | INTRAVENOUS | Status: DC
  Administered 2015-09-06: 2 ug/min via INTRAVENOUS
  Filled 2015-09-06: qty 4

## 2015-09-06 MED ORDER — HYDROCORTISONE NA SUCCINATE PF 100 MG IJ SOLR: 50.0000 mg | Freq: Four times a day (QID) | INTRAMUSCULAR | Status: DC

## 2015-09-06 MED ORDER — DEXTROSE 5 % IV SOLN
2.0000 ug/min | INTRAVENOUS | Status: DC
  Administered 2015-09-06: 25 ug/min via INTRAVENOUS
  Filled 2015-09-06: qty 4

## 2015-09-06 MED ORDER — DEXTROSE 5 % IV SOLN
2.0000 g | INTRAVENOUS | Status: DC
  Filled 2015-09-06: qty 2

## 2015-09-06 MED ORDER — VASOPRESSIN 20 UNIT/ML IV SOLN
0.0300 [IU]/min | INTRAVENOUS | Status: DC
  Administered 2015-09-06: 0.03 [IU]/min via INTRAVENOUS
  Filled 2015-09-06: qty 2

## 2015-09-06 MED ORDER — AMIODARONE HCL IN DEXTROSE 360-4.14 MG/200ML-% IV SOLN
60.0000 mg/h | INTRAVENOUS | Status: DC
  Administered 2015-09-06 (×2): 60 mg/h via INTRAVENOUS
  Filled 2015-09-06: qty 200

## 2015-09-06 MED ORDER — OSELTAMIVIR PHOSPHATE 6 MG/ML PO SUSR
150.0000 mg | Freq: Two times a day (BID) | ORAL | Status: DC
  Filled 2015-09-06: qty 25

## 2015-09-06 MED ORDER — DEXMEDETOMIDINE HCL IN NACL 400 MCG/100ML IV SOLN
0.4000 ug/kg/h | INTRAVENOUS | Status: AC
  Administered 2015-09-06: .5 ug/kg/h via INTRAVENOUS
  Filled 2015-09-06: qty 100

## 2015-09-06 MED ORDER — MIDAZOLAM BOLUS VIA INFUSION
1.0000 mg | INTRAVENOUS | Status: DC | PRN
  Filled 2015-09-06: qty 2

## 2015-09-06 MED ORDER — SODIUM CHLORIDE 0.9 % IV SOLN
1.0000 g | Freq: Once | INTRAVENOUS | Status: AC
  Administered 2015-09-06: 1 g via INTRAVENOUS
  Filled 2015-09-06: qty 10

## 2015-09-06 MED ORDER — SODIUM CHLORIDE 0.9 % IV SOLN
INTRAVENOUS | Status: DC | PRN
  Administered 2015-09-06: 500 mL

## 2015-09-06 MED ORDER — CHLORHEXIDINE GLUCONATE CLOTH 2 % EX PADS
6.0000 | MEDICATED_PAD | Freq: Every day | CUTANEOUS | Status: DC
  Administered 2015-09-06: 6 via TOPICAL

## 2015-09-06 MED ORDER — HEPARIN (PORCINE) IN NACL 100-0.45 UNIT/ML-% IJ SOLN
700.0000 [IU]/h | INTRAMUSCULAR | Status: DC
  Filled 2015-09-06: qty 250

## 2015-09-06 MED ORDER — ASPIRIN 300 MG RE SUPP
300.0000 mg | Freq: Once | RECTAL | Status: DC
Start: 2015-09-06 — End: 2015-09-06

## 2015-09-06 MED ORDER — SODIUM CHLORIDE 0.9 % IV SOLN
INTRAVENOUS | Status: DC
  Administered 2015-09-06: 2.8 [IU]/h via INTRAVENOUS
  Filled 2015-09-06: qty 2.5

## 2015-09-06 MED ORDER — DEXTROSE 5 % IV SOLN
0.0000 ug/min | INTRAVENOUS | Status: DC
  Filled 2015-09-06: qty 4

## 2015-09-06 MED ORDER — DEXTROSE 5 % IV SOLN
2.0000 ug/min | INTRAVENOUS | Status: DC
  Administered 2015-09-06: 36 ug/min via INTRAVENOUS
  Administered 2015-09-06: 50 ug/min via INTRAVENOUS
  Filled 2015-09-06 (×2): qty 16

## 2015-09-06 MED ORDER — SODIUM CHLORIDE 0.9 % IV SOLN
0.5000 ug/kg/min | INTRAVENOUS | Status: DC
  Filled 2015-09-06: qty 20

## 2015-09-06 MED ORDER — SODIUM CHLORIDE 0.9 % IV SOLN
INTRAVENOUS | Status: DC | PRN
  Administered 2015-09-06: 15:00:00 via INTRA_ARTERIAL

## 2015-09-06 MED ORDER — CALCIUM CHLORIDE 10 % IV SOLN
INTRAVENOUS | Status: DC | PRN
  Administered 2015-09-06 (×10): .1 g via INTRAVENOUS

## 2015-09-06 MED ORDER — SODIUM BICARBONATE 8.4 % IV SOLN
INTRAVENOUS | Status: DC | PRN
  Administered 2015-09-06 (×4): 50 meq via INTRAVENOUS

## 2015-09-06 MED ORDER — ROCURONIUM BROMIDE 50 MG/5ML IV SOLN
INTRAVENOUS | Status: AC
  Filled 2015-09-06: qty 1

## 2015-09-06 MED ORDER — MUPIROCIN 2 % EX OINT
1.0000 "application " | TOPICAL_OINTMENT | Freq: Two times a day (BID) | CUTANEOUS | Status: DC
  Administered 2015-09-06: 1 via NASAL
  Filled 2015-09-06: qty 22

## 2015-09-06 MED ORDER — MIDAZOLAM HCL 10 MG/2ML IJ SOLN
INTRAMUSCULAR | Status: AC
  Filled 2015-09-06: qty 2

## 2015-09-06 MED ORDER — AMIODARONE HCL IN DEXTROSE 360-4.14 MG/200ML-% IV SOLN
30.0000 mg/h | INTRAVENOUS | Status: DC
  Filled 2015-09-06: qty 200

## 2015-09-06 MED ORDER — CEFAZOLIN SODIUM-DEXTROSE 2-3 GM-% IV SOLR
INTRAVENOUS | Status: DC | PRN
  Administered 2015-09-06: 2 g via INTRAVENOUS

## 2015-09-06 MED ORDER — SODIUM CHLORIDE 0.9 % IV SOLN
INTRAVENOUS | Status: DC
  Administered 2015-09-06 (×3): via INTRAVENOUS

## 2015-09-06 MED ORDER — ANTISEPTIC ORAL RINSE SOLUTION (CORINZ)
7.0000 mL | Freq: Four times a day (QID) | OROMUCOSAL | Status: DC
  Administered 2015-09-06: 7 mL via OROMUCOSAL

## 2015-09-06 MED ORDER — DEXTROSE 5 % IV SOLN
1.0000 g | INTRAVENOUS | Status: DC
  Filled 2015-09-06: qty 10

## 2015-09-06 MED ORDER — MILRINONE IN DEXTROSE 20 MG/100ML IV SOLN
0.1250 ug/kg/min | INTRAVENOUS | Status: DC
  Filled 2015-09-06: qty 100

## 2015-09-06 MED ORDER — DEXMEDETOMIDINE HCL IN NACL 200 MCG/50ML IV SOLN
INTRAVENOUS | Status: AC
  Filled 2015-09-06: qty 100

## 2015-09-06 MED ORDER — FENTANYL BOLUS VIA INFUSION
50.0000 ug | INTRAVENOUS | Status: DC | PRN
  Filled 2015-09-06: qty 50

## 2015-09-06 MED ORDER — DOBUTAMINE IN D5W 4-5 MG/ML-% IV SOLN
2.5000 ug/kg/min | INTRAVENOUS | Status: DC
  Administered 2015-09-06: 2.5 ug/kg/min via INTRAVENOUS
  Filled 2015-09-06: qty 250

## 2015-09-06 MED ORDER — SODIUM CHLORIDE 0.9 % IV SOLN: Freq: Once | INTRAVENOUS | Status: DC

## 2015-09-06 MED ORDER — DEXTROSE 5 % IV SOLN: 2.0000 g | INTRAVENOUS | Status: DC

## 2015-09-06 MED ORDER — CHLORHEXIDINE GLUCONATE 0.12% ORAL RINSE (MEDLINE KIT)
15.0000 mL | Freq: Two times a day (BID) | OROMUCOSAL | Status: DC
  Administered 2015-09-06: 15 mL via OROMUCOSAL

## 2015-09-06 MED ORDER — SODIUM CHLORIDE 0.9 % IV SOLN
0.0000 mg/h | INTRAVENOUS | Status: DC
  Administered 2015-09-06 (×2): 4 mg/h via INTRAVENOUS
  Filled 2015-09-06 (×4): qty 10

## 2015-09-06 MED ORDER — AMIODARONE HCL IN DEXTROSE 360-4.14 MG/200ML-% IV SOLN
INTRAVENOUS | Status: AC
  Filled 2015-09-06: qty 200

## 2015-09-06 MED ORDER — VITAL HIGH PROTEIN PO LIQD: 1000.0000 mL | ORAL | Status: DC

## 2015-09-06 MED ORDER — DEXTROSE 5 % IV SOLN
2.0000 g | Freq: Three times a day (TID) | INTRAVENOUS | Status: DC
  Filled 2015-09-06 (×3): qty 2

## 2015-09-06 MED ORDER — HEPARIN BOLUS VIA INFUSION
3500.0000 [IU] | Freq: Once | INTRAVENOUS | Status: DC
Start: 2015-09-06 — End: 2015-09-06
  Filled 2015-09-06: qty 3500

## 2015-09-06 MED ORDER — FENTANYL CITRATE (PF) 250 MCG/5ML IJ SOLN
INTRAMUSCULAR | Status: AC
  Filled 2015-09-06: qty 10

## 2015-09-06 MED ORDER — ACETAMINOPHEN 160 MG/5ML PO SOLN
650.0000 mg | Freq: Four times a day (QID) | ORAL | Status: DC | PRN
  Administered 2015-09-06 (×2): 650 mg via ORAL
  Filled 2015-09-06 (×2): qty 20.3

## 2015-09-06 MED ORDER — ROCURONIUM BROMIDE 50 MG/5ML IV SOLN
60.0000 mg | Freq: Once | INTRAVENOUS | Status: AC
  Administered 2015-09-06: 60 mg via INTRAVENOUS
  Filled 2015-09-06: qty 6

## 2015-09-06 MED ORDER — FENTANYL CITRATE (PF) 100 MCG/2ML IJ SOLN: 50.0000 ug | Freq: Once | INTRAMUSCULAR | Status: AC

## 2015-09-06 MED ORDER — ROCURONIUM BROMIDE 100 MG/10ML IV SOLN
INTRAVENOUS | Status: DC | PRN
  Administered 2015-09-06 (×2): 50 mg via INTRAVENOUS

## 2015-09-06 MED ORDER — MIDAZOLAM HCL 5 MG/5ML IJ SOLN
INTRAMUSCULAR | Status: DC | PRN
  Administered 2015-09-06: 4 mg via INTRAVENOUS

## 2015-09-06 MED ORDER — OSELTAMIVIR PHOSPHATE 6 MG/ML PO SUSR
75.0000 mg | Freq: Two times a day (BID) | ORAL | Status: DC
  Administered 2015-09-06: 75 mg
  Filled 2015-09-06 (×2): qty 12.5

## 2015-09-06 MED ORDER — SODIUM CHLORIDE 0.9 % IV SOLN
25.0000 ug/h | INTRAVENOUS | Status: DC
  Administered 2015-09-06: 300 ug/h via INTRAVENOUS
  Filled 2015-09-06 (×2): qty 50

## 2015-09-06 MED ORDER — AMIODARONE LOAD VIA INFUSION
150.0000 mg | Freq: Once | INTRAVENOUS | Status: AC
  Administered 2015-09-06: 150 mg via INTRAVENOUS
  Filled 2015-09-06: qty 83.34

## 2015-09-06 MED ORDER — SODIUM CHLORIDE 0.9 % IV BOLUS (SEPSIS)
1000.0000 mL | Freq: Once | INTRAVENOUS | Status: AC
  Administered 2015-09-06: 1000 mL via INTRAVENOUS

## 2015-09-06 MED ORDER — FILGRASTIM 480 MCG/1.6ML IJ SOLN
480.0000 ug | Freq: Once | INTRAVENOUS | Status: DC
  Filled 2015-09-06: qty 1.6

## 2015-09-06 MED ORDER — HEPARIN SODIUM (PORCINE) 1000 UNIT/ML IJ SOLN
INTRAMUSCULAR | Status: DC | PRN
  Administered 2015-09-06: 5000 [IU] via INTRAVENOUS

## 2015-09-06 MED ORDER — LIDOCAINE HCL (CARDIAC) 20 MG/ML IV SOLN
INTRAVENOUS | Status: DC | PRN
  Administered 2015-09-06: 100 mg via INTRAVENOUS

## 2015-09-06 MED ORDER — TBO-FILGRASTIM 480 MCG/0.8ML ~~LOC~~ SOSY
480.0000 ug | PREFILLED_SYRINGE | SUBCUTANEOUS | Status: DC
  Filled 2015-09-06: qty 0.8

## 2015-09-06 SURGICAL SUPPLY — 32 items
BLADE STERNUM SYSTEM 6 (BLADE) ×3 IMPLANT
BLADE SURG 11 STRL SS (BLADE) ×3 IMPLANT
BLADE SURG 12 STRL SS (BLADE) ×3 IMPLANT
CANNULA FEM VENOUS REMOTE 22FR (CANNULA) ×3 IMPLANT
CANNULA OPTISITE PERFUSION 18F (CANNULA) ×3 IMPLANT
CLIP TI LARGE 6 (CLIP) ×3 IMPLANT
CLIP TI MEDIUM 24 (CLIP) ×3 IMPLANT
CONN 3/8X1/2 ST GISH (MISCELLANEOUS) ×3 IMPLANT
DRSG AQUACEL AG ADV 3.5X14 (GAUZE/BANDAGES/DRESSINGS) ×3 IMPLANT
DRSG OPSITE 4X5.5 SM (GAUZE/BANDAGES/DRESSINGS) ×3 IMPLANT
DRSG OPSITE 6X11 MED (GAUZE/BANDAGES/DRESSINGS) ×9 IMPLANT
FEMORAL VENOUS CANN RAP (CANNULA) ×3 IMPLANT
GAUZE SPONGE 4X4 12PLY STRL (GAUZE/BANDAGES/DRESSINGS) ×3 IMPLANT
GLOVE BIO SURGEON STRL SZ 6.5 (GLOVE) ×2 IMPLANT
GLOVE BIO SURGEON STRL SZ7.5 (GLOVE) ×9 IMPLANT
GLOVE BIO SURGEONS STRL SZ 6.5 (GLOVE) ×1
KIT DILATOR VASC 18G NDL (KITS) ×3 IMPLANT
PACK CHEST (CUSTOM PROCEDURE TRAY) ×3 IMPLANT
PAD CARDIAC INSULATION (MISCELLANEOUS) ×3 IMPLANT
SHEATH AVANTI 11CM 5FR (MISCELLANEOUS) ×3 IMPLANT
SUT ETHILON 3 0 FSL (SUTURE) ×12 IMPLANT
SUT PROLENE 4 0 RB 1 (SUTURE) ×2
SUT PROLENE 4-0 RB1 .5 CRCL 36 (SUTURE) ×1 IMPLANT
SUT PROLENE 5 0 C 1 36 (SUTURE) ×12 IMPLANT
SUT PROLENE 6 0 C 1 30 (SUTURE) ×3 IMPLANT
SUT SILK  1 MH (SUTURE) ×16
SUT SILK 1 MH (SUTURE) ×8 IMPLANT
SUT SILK 2 0 SH CR/8 (SUTURE) ×3 IMPLANT
SYR 20CC LL (SYRINGE) ×3 IMPLANT
TABLE PACK (MISCELLANEOUS) ×3 IMPLANT
TUBE CONNECTING 12'X1/4 (SUCTIONS) ×1
TUBE CONNECTING 12X1/4 (SUCTIONS) ×2 IMPLANT

## 2015-09-06 NOTE — Consult Note (Addendum)
    ADVANCED HF CONSULT NOTE   Asked to see patient by Dr. Tyson [name] of CCM   35 y/o male with minimal PMHx except for THC use. Presented to ER with several day h/o URI symptoms. In ER found to have bilateral PNA with respiratory failure and intubated.  Flu and strep tests negative.  Echo this AM EF 60%. Over past few hours has developed rapidly progressive shock and acidosis. Now on 100% FiO2.   Repeat echo at bedside (performed personally) shows LVEF 20%. With severe RV hypokinesis. Now on full dose levophed, dobutamine 2.5, vasopressin and epinephrine. HR in 140s. SBP 70-100. Runs NSVT.  Troponin 1.4-> 27. Lactate 3.0. CXR with progressive severe bilateral air space disease. Initial blood counts ok now developing pancytopenia. HIV negative  Exam:  Intubate. 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 Suspect due to viral myocarditis/PNA. Influenza A/B are negative. His only chance for survival is emergent ECMO. I have spoken with Dr. Donata ClayVan Trigt our CCM team and the [facility] ECMO team. Will take to OR for ECMO cannulation [facility]. Acute malignancy (AML) also a possibility but unlikely but initial counts normal and suspect pancytopenia due to viral suppression.   The patient is critically ill with multiple organ systems failure and requires high complexity decision making for assessment and support, frequent evaluation and titration of therapies, application of advanced monitoring technologies and extensive interpretation of multiple databases.    Critical Care Time devoted to patient care services described in this note is 60 Minutes.   Bensimhon, Daniel,MD 2:28 PM

## 2015-09-06 NOTE — Progress Notes (Signed)
1235 Critical value Troponin noted to have changed from 1.40 to 26.62. Changes immediately communicated to the MD - interdiscipline team paged to the bedside. Due to critical changes; 1525 Pt taken to OR. On board medications included: Versed, Fentanyl,  Levophed, Dobutamine, Amiodarone, Epi. Heparin held per [facility] and cardiologist for upcoming OR procedure. (See MAR for doses and titration) CVP ranging between 8-10, Arterial line placed, second central venous catheter placed by physician. 1525 OR staff present at bedside, all questions answered, patient to be taken to OR #16. All continuous medications with duplicate bags per MD request, blood pre -ordered and ready per blood bank. Family waiting and updated per MD. [facility] post OR.

## 2015-09-06 NOTE — Brief Op Note (Addendum)
09/05/2015 - 09/06/2015  6:06 PM  PATIENT:  Matthew [name]  35 y.o. male  PRE-OPERATIVE DIAGNOSIS:  ARDS, shock  POST-OPERATIVE DIAGNOSIS:  ARDS,shock  PROCEDURE:  Procedure(s): CANNULATION FOR ECMO (N/A) INTRAOPERATIVE TRANSESOPHAGEAL ECHOCARDIOGRAM (N/A)   66F arterial cannula to L femoral artery ( cut down) 23/25 two staged venous cannula to right atrium from R femoral vein   SURGEON:  Surgeon(s) and Role:    * Kerin PernaPeter Van Trigt, MD - Primary  PHYSICIAN ASSISTANT: 0  ASSISTANTS: Dr Arbutus PedMohamed, Dr Nicholos Johnseade [facility]( [facility])  ANESTHESIA:   general  EBL:  Total I/O In: 2171.7 [P.O.:14; I.V.:1157.7; IV Piggyback:1000] Out: 2110 [Urine:2110]  BLOOD ADMINISTERED:none  DRAINS: none   LOCAL MEDICATIONS USED:  NONE  SPECIMEN:  No Specimen  DISPOSITION OF SPECIMEN:  N/A  COUNTS:  YES  TOURNIQUET:  * No tourniquets in log *  DICTATION: .Dragon Dictation  [facility]  [facility]:  [facility]   Delay start of Pharmacological VTE agent (>24hrs) due to surgical blood loss or risk of bleeding: yes

## 2015-09-06 NOTE — Procedures (Signed)
Chest ultrasound bedside  The left hemithorax was examined with bedside ultrasound; consolidated lung seen, only trace effusion noted  Heber CarolinaBrent McQuaid, MD [place] [facility] Pager: [phone] Cell: [phone] After 3pm or if no response, call [phone]

## 2015-09-06 NOTE — Progress Notes (Signed)
Patient went to OR at 1525. Patient's belongings were brought to the OR and given to the nephew, Family updated.

## 2015-09-06 NOTE — Progress Notes (Signed)
  Echocardiogram 2D Echocardiogram has been performed.  Matthew SavoyCasey N Aloni [name] 09/06/2015, 9:31 AM

## 2015-09-06 NOTE — Progress Notes (Signed)
Verbal order for sedation of patient for multiple procedures from Versed & Fentanyl bags.  Procedures included bedside bronchoscope and  Central line placement.  Patient was on Fentanyl 200 mg gtt & Versed 4 mg gtt and boluses of Versed 2mg  X 3 and boluses of 100 Fentanyl X 4.  Witnessed by Joneen RoachPaul Hoffman, NP & Dr. Molli KnockYacoub.

## 2015-09-06 NOTE — Progress Notes (Signed)
Bedside Bronchoscopy done at the bedside with Dr.Yacoub. Patient placed on 100% fio2 during procedure.

## 2015-09-06 NOTE — Transfer of Care (Addendum)
Immediate Anesthesia Transfer of Care Note  Patient: Matthew [name]  Procedure(s) Performed: Procedure(s): CANNULATION FOR ECMO (N/A) INTRAOPERATIVE TRANSESOPHAGEAL ECHOCARDIOGRAM (N/A)  Patient Location: [facility] Life-[facility] Team  Anesthesia Type:General  Level of Consciousness: Patient remains intubated per anesthesia plan  Airway & Oxygen Therapy: Patient placed on Ventilator (see vital sign flow sheet for setting)  Post-op Assessment: Report given to RN and Post -op Vital signs reviewed and stable  Post vital signs: Reviewed and stable  Last Vitals:  Filed Vitals:   09/06/15 1500 09/06/15 1515  BP: 122/72   Pulse: 113 116  Temp: 37.9 C   Resp:      Complications: No apparent anesthesia complications Please see intraop anesthesia notes.

## 2015-09-06 NOTE — Progress Notes (Signed)
PULMONARY / CRITICAL CARE MEDICINE   Name: Matthew [name] MRN: [id number] DOB: Sep 08, 1980    ADMISSION DATE:  09/05/2015 CONSULTATION DATE:  09/05/15  REFERRING MD :  ED Physician  PRIMARY SERVICE: Critical Care   CHIEF COMPLAINT: SOB, cough, N/V  HISTORY OF PRESENT ILLNESS:  Patient is a 35 year old male with no significant past medical history presenting to [facility] emergency room complaining of dyspnea for 1 week and coughing up blood. Patient had increased work of breathing and was coughing up blood. Remaining history was obtained from emergency room documentation. He reported not feeling any better despite taking antibiotics for recently diagnosed pneumonia. He was found to have abnormal lactic acid (5.79) and code sepsis was called. Patient was found to have temperature 102.9, HR 115, RR 22, white blood cell 1.5, and serum creatinine 1.8. He was started on Vanco and Zosyn in the ED for coverage of pneumococcal community-acquired pneumonia. Patient had increased work of breathing and was coughing up blood, as such, he was intubated in the ED  Patient presented to the [facility] emergency room 09/04/2015 with body aches, nausea, abdominal pain, subjective fever, and headache for the last 4 days. He endorsed vomiting during the first 2 days of his illness. Denied having any sick contacts. Reported having abdominal pain which is periumbilical,  10/10 intensity, "feels like a knot," nonradiating. Reported having headache which was bilateral, "all over," rated 10/10, throbbing, nonradiating. Patient had not taken any medications. He denied chills, diarrhea/constipation, chest pain, shortness of breath, or any pain or complaints. He later reported to the physician there that he was having nonproductive cough over the last 2-3 days.Patient was deemed to have possible viral illness and discharged the same day.   SUBJECTIVE: remains intubated, sedated  VITAL SIGNS: Temp:  [99.5 F (37.5 C)-103 F (39.4  C)] 99.7 F (37.6 C) (01/10 0600) Pulse Rate:  [107-156] 110 (01/10 0630) Resp:  [16-46] 26 (01/10 0630) BP: (62-144)/(47-100) 77/57 mmHg (01/10 0630) SpO2:  [86 %-100 %] 95 % (01/10 0630) FiO2 (%):  [50 %-70 %] 50 % (01/10 0322) Weight:  [125 lb (56.7 kg)-130 lb 11.7 oz (59.3 kg)] 130 lb 11.7 oz (59.3 kg) (01/10 0358) HEMODYNAMICS:   VENTILATOR SETTINGS: Vent Mode:  [-] PRVC FiO2 (%):  [50 %-70 %] 50 % Set Rate:  [26 bmp] 26 bmp Vt Set:  [450 mL] 450 mL PEEP:  [8 cmH20-14 cmH20] 8 cmH20 Plateau Pressure:  [22 cmH20-26 cmH20] 22 cmH20 INTAKE / OUTPUT: Intake/Output      01/09 0701 - 01/10 0700 01/10 0701 - 01/11 0700   I.V. (mL/kg) 4479 (75.5)    IV Piggyback 100    Total Intake(mL/kg) 4579 (77.2)    Urine (mL/kg/hr) 700    Total Output 700     Net +3879            PHYSICAL EXAMINATION: General: Young AAM, sedated Neuro: sedated HEENT: [place]/AT, left IJ placed Cardiovascular: tachy regular, no m/r/g Lungs: CTA anteriorly, vent assisted Abdomen: soft, non-distended, +BS Musculoskeletal: SCDs in place Skin: no apparent rash or lesions   LABS:  CBC  Recent Labs Lab 09/05/15 1821 09/05/15 1926 09/06/15 0400  WBC 1.2* 0.9* 1.4*  HGB 13.5 14.7 12.9*  HCT 39.1 41.8 38.2*  PLT 70* 85* 131*   Coag's  Recent Labs Lab 09/05/15 1926  APTT 37  INR 1.47   BMET  Recent Labs Lab 09/05/15 1821 09/05/15 1926 09/06/15 0400  NA 133* 137 134*  K 3.1* 3.9  3.7  CL 102 106 105  CO2 16* 17* 20*  BUN 8 8 9   CREATININE 1.65* 1.60* 1.38*  GLUCOSE 141* 114* 171*   Electrolytes  Recent Labs Lab 09/05/15 1821 09/05/15 1926 09/06/15 0400  CALCIUM 6.6* 6.8* 6.2*  MG  --  1.3* 1.8  PHOS  --  2.0* 3.3   Sepsis Markers  Recent Labs Lab 09/05/15 1817 09/05/15 1926 09/06/15 0043  LATICACIDVEN 6.42* 6.8* 3.0*  PROCALCITON  --  50.08  --    ABG  Recent Labs Lab 09/05/15 2018 09/06/15 0128 09/06/15 0410  PHART 7.183* 7.272* 7.335*  PCO2ART 39.3 38.2  34.7*  PO2ART 205.0* 68.0* 70.2*   Liver Enzymes  Recent Labs Lab 09/04/15 1601 09/05/15 1541 09/05/15 1926  AST 37 69* 75*  ALT 21 <5* <5*  ALKPHOS 65 42 35*  BILITOT 0.7 0.6 1.4*  ALBUMIN 3.9 2.9* 2.4*   Cardiac Enzymes  Recent Labs Lab 09/05/15 1926 09/06/15 0043  TROPONINI 0.07* 0.15*   Glucose  Recent Labs Lab 09/05/15 2319 09/06/15 0014 09/06/15 0348  GLUCAP 108* 114* 155*    Imaging Dg Chest 2 View  09/04/2015  CLINICAL DATA:  Weakness and cough.  Fever EXAM: CHEST  2 VIEW COMPARISON:  None FINDINGS: Patchy density in the left base is noted, suspicious for pneumonia. Right lung is clear. No pleural effusion or edema. No airspace consolidation. IMPRESSION: 1. Left base opacity suspicious for pneumonia. Electronically Signed   By: Signa Kellaylor  Stroud M.D.   On: 09/04/2015 17:00   Dg Chest Port 1 View  09/06/2015  CLINICAL DATA:  35 year old male with central line placement. EXAM: PORTABLE CHEST 1 VIEW COMPARISON:  Earlier Radiograph dated 09/05/2015 FINDINGS: Endotracheal tube approximately 3 cm above the carina. Left IJ central line with tip at the cavoatrial junction. Partially visualized enteric tube extending to the left hemiabdomen. Bilateral airspace opacities again noted likely multifocal pneumonia. There is no pneumothorax. There is silhouetting of the left hemidiaphragm and left cardiac border. The osseous structures appear unremarkable. IMPRESSION: Interval placement of a left IJ central line with tip at the cavoatrial junction. No pneumothorax. Bilateral patchy airspace opacities most compatible with pneumonia. Electronically Signed   By: Elgie CollardArash  Radparvar M.D.   On: 09/06/2015 01:41   Dg Chest Portable 1 View  09/05/2015  CLINICAL DATA:  35 year old male status post intubation.  Pneumonia. EXAM: PORTABLE CHEST 1 VIEW COMPARISON:  Chest x-ray 09/05/2015 at 18:24. FINDINGS: An endotracheal tube is in place with tip 4.3 cm above the carina. A nasogastric tube is seen  extending into the stomach, however, the tip of the nasogastric tube extends below the lower margin of the image. Lung volumes are normal. Patchy multifocal airspace disease asymmetrically distributed throughout the lungs, most confluent throughout the left mid to lower lung, but also very confluent in the right mid lung. Relative sparing of the right base and right apex. No definite pleural effusions. No evidence of pulmonary edema. Heart size is normal. The patient is rotated to the right on today's exam, resulting in distortion of the mediastinal contours and reduced diagnostic sensitivity and specificity for mediastinal pathology. IMPRESSION: 1. Support apparatus, as above. 2. Patchy multifocal asymmetrically distributed airspace disease favored to reflect multilobar pneumonia. Electronically Signed   By: Trudie Reedaniel  Entrikin M.D.   On: 09/05/2015 20:12   Dg Chest Portable 1 View  09/05/2015  CLINICAL DATA:  Worsening dyspnea. Recent diagnosis of pneumonia, now with hemoptysis. EXAM: PORTABLE CHEST 1 VIEW COMPARISON:  Radiograph earlier this  day at 1613 hour, radiographs yesterday and 1634 hour FINDINGS: Increased density of the bilateral multifocal opacities involving the right perihilar lung, left mid upper and lower lung zones. The heart size is normal. No evident pleural effusion or pneumothorax. No acute osseous abnormalities. IMPRESSION: Increased density of bilateral multifocal opacities from exam 2 hours prior, and rather extensive progression from exam performed yesterday. While multifocal pneumonia could have this appearance, a component of pulmonary edema or ARDS is considered. Electronically Signed   By: Rubye Oaks M.D.   On: 09/05/2015 18:58   Dg Chest Port 1 View  09/05/2015  CLINICAL DATA:  35 year old male with sepsis, fever and cough. EXAM: PORTABLE CHEST 1 VIEW COMPARISON:  09/04/2015. FINDINGS: The cardiomediastinal silhouette is unremarkable. New airspace disease within the mid right  lung and throughout the majority of the left lung noted, compatible with multi focal pneumonia. There is no evidence of pleural effusion or pneumothorax. No acute bony abnormalities are identified. IMPRESSION: New bilateral airspace disease, left-greater-than-right, compatible with bilateral multi focal pneumonia. Electronically Signed   By: Harmon Pier M.D.   On: 09/05/2015 16:22     CXR:  09/04/15: Left base opacity suspicious for pneumonia. 09/05/15: New bilateral airspace disease, left greater than right, compatible with bilateral multifocal pneumonia. Repeat chest x-ray 2 hours later showing increased density of bilateral multifocal opacities from prior exam. A component of pulmonary edema or ARDS is considered.  Cultures: 1/9 blood >> 1/9 urine >> 1/10 tracheal aspirate >> 1/10 BAL >> 1/10 sputum >> 1/10 fungal BAL >> 1/10 AFB BAL >> 1/10 RSV >>  ASSESSMENT / PLAN:  PULMONARY A: Severe sepsis secondary to bilateral multifocal pneumonia and possible ARDS requiring intubation.  P:   -Levophed to keep MAP greater than 65 -IV Vancomycin per pharmacy  -IV Levaquin 750 mg every 48 hours  -Patient is currently on vent, wean as tolerated -Continue fentanyl infusion and PRN fentanyl, wean as tolerated -Continue Versed prn agitation -Neutropenic precaution -Influenza panel negative -Urine strep pneumo negative, Legionella antigen pending -Pending blood culture x2 -Pending urine culture -Follow-up HIV antibody -ANCA, double-stranded DNA, ANA pending  CARDIOVASCULAR A: Tachycardia Elevated troponin - 0.07 -> 1.40, likely 2/2 to acute illness P:  -EKG -Continue IV fluid resuscitation -Trend troponin -ECHO  RENAL A:  Hypomagnesemia - improving Hypokalemia - improving Hypophosphatemia - improving Hypocalcemia - 6.2 corrects to 7.48 AKI - Scr 1.8 -> 1.38 P:   -Replace electrolytes as needed -Follow-up a.m. BMP  GASTROINTESTINAL A:  GI prophylaxis P:   -IV Protonix 40 mg  daily  HEMATOLOGIC A:  Leukopenia - white count 1.4. Differential showing low neutrophils 0.7 and low lymphocytes 0.4. Thrombocytopenia - platelets 90 DVT prophylaxis P:  -Heparin 5000 units subcutaneous every 8 hours for DVT prophylaxis -Follow-up a.m. CBC  INFECTIOUS A:  Severe sepsis secondary to bilateral multifocal pneumonia  P:   -IV Vancomycin per pharmacy  -IV Levaquin 750 mg every 48 hours  -Droplet precaution -Influenza panel still pending; treat empirically with Tamiflu. -Urine strep pneumo and Legionella antigen pending -Follow-up a.m. CBC -Pending blood culture x2 -Pending urine culture -Follow-up HIV antibody   ENDOCRINE A:  CBG 114 P:   -Continue to monitor  NEUROLOGIC A:  No active issue prior to sedation. P:   RASS of 0 to -2. Versed/fentnyl for sedation.  Darreld Mclean, MD Internal Medicine PGY1 Pager: [phone]

## 2015-09-06 NOTE — Progress Notes (Signed)
CRITICAL VALUE ALERT  Critical value received:  Troponin 26.62  Date of notification:  09/06/15  Time of notification: 1250  Critical value read back: No one called  Nurse who received alert:  Assessed in  Epic by Tommye Standardrystal Manus/Merdith Boyd  MD notified (1st page):  Dr. Kendrick FriesMcquaid  Time of first page:  1253  Time MD responded:  1253 At the bedside

## 2015-09-06 NOTE — Progress Notes (Signed)
[facility]  11:30 troponin is 26 Worsening shock Oliguria  Hypoxemia better, vent synchrony better  Impression; I worry he is developing cardiogenic shock May need ECMO  Plan Asa now coox now Need cardiology to read echo now Stat cards consult Urine drug screen now Heparin per pharmacy now  Additional CC time 35 minutes  Heber CarolinaBrent Ophie Burrowes, MD [place] [facility] Pager: [phone] Cell: [phone] After 3pm or if no response, call [phone]

## 2015-09-06 NOTE — Procedures (Signed)
Arterial Catheter Insertion Procedure Note Matthew BearsJay [name] [id number] 09-Jan-1981  Procedure: Insertion of Arterial Catheter  Indications: Blood pressure monitoring and Frequent blood sampling  Procedure Details Consent: Unable to obtain consent because of emergent medical necessity. Time Out: Verified patient identification, verified procedure, site/side was marked, verified correct patient position, special equipment/implants available, medications/allergies/relevent history reviewed, required imaging and test results available.  Performed  Maximum sterile technique was used including antiseptics, cap, gloves, gown, hand hygiene, mask and sheet. Skin prep: Chlorhexidine; local anesthetic administered 20 gauge catheter was inserted into right femoral artery using the Seldinger technique.  Evaluation Blood flow good; BP tracing good. Complications: No apparent complications.   Matthew [name],Matthew [name]  Matthew [name]  Matthew [name] J. Matthew [name], MD, FACP Pgr: [phone] Matthew [name]

## 2015-09-06 NOTE — Anesthesia Preprocedure Evaluation (Signed)
Anesthesia Evaluation  Patient identified by MRN, date of birth, ID band Patient unresponsive  Preop documentation limited or incomplete due to emergent nature of procedure.  Airway Mallampati: Intubated       Dental   Pulmonary pneumonia,     + decreased breath sounds  + intubated    Cardiovascular +CHF   Rhythm:regular Rate:Tachycardia  Acute cardiomyopathy.   Neuro/Psych    GI/Hepatic   Endo/Other    Renal/GU      Musculoskeletal   Abdominal   Peds  Hematology   Anesthesia Other Findings   Reproductive/Obstetrics                             Anesthesia Physical Anesthesia Plan  ASA: IV and emergent  Anesthesia Plan: General   Post-op Pain Management:    Induction: Intravenous  Airway Management Planned: Oral ETT  Additional Equipment: Arterial line, CVP, PA Cath and TEE  Intra-op Plan:   Post-operative Plan: Post-operative intubation/ventilation  Informed Consent: I have reviewed the patients History and Physical, chart, labs and discussed the procedure including the risks, benefits and alternatives for the proposed anesthesia with the patient or authorized representative who has indicated his/her understanding and acceptance.     Plan Discussed with: CRNA, Anesthesiologist and Surgeon  Anesthesia Plan Comments:         Anesthesia Quick Evaluation

## 2015-09-06 NOTE — Procedures (Addendum)
Central Venous Catheter Insertion Procedure Note -cordis for Matthew [name] [id number] 1981-02-15   At around 3 pm  Procedure: Insertion of Central Venous Catheter Indications: for swan  Procedure Details Consent: Unable to obtain consent because of emergent medical necessity. Time Out: Verified patient identification, verified procedure, site/side was marked, verified correct patient position, special equipment/implants available, medications/allergies/relevent history reviewed, required imaging and test results available.  Performed  Maximum sterile technique was used including antiseptics, cap, gloves, gown, hand hygiene, mask and sheet. Skin prep: Chlorhexidine; local anesthetic administered A antimicrobial bonded/coated single lumen catheter was placed in the right internal jugular vein using the Seldinger technique.  Evaluation Blood flow good Complications: No apparent complications Patient did tolerate procedure well. Chest X-ray ordered to verify placement.  CXR: pending.  Nelda BucksFEINSTEIN,DANIEL J. 09/06/2015, 7:49 PM   US guidance  Attempted to place swan but staff unable to get waveform so no placement i ICU done To be done in OR   Mcarthur RossettiDaniel J. Tyson [name], MD, FACP Pgr: [phone] [place] [facility]

## 2015-09-06 NOTE — Progress Notes (Signed)
  Echocardiogram Echocardiogram Transesophageal has been performed.  Leta JunglingCooper, Kiarra Kidd M 09/06/2015, 4:19 PM

## 2015-09-06 NOTE — Progress Notes (Signed)
CRITICAL VALUE ALERT  Critical value received:  Lactic Acid 3.0 and Troponin 1.40  Date of notification:  09/06/2015  Time of notification:  0740  Critical value read back:Yes.    Nurse who received alert:  Marica OtterMaria Withrow, RN   MD notified (1st page):  [phone] Olevia PerchesShawn Patel, MD present on floor at time of page and notified of results.

## 2015-09-06 NOTE — Progress Notes (Signed)
Patient ID: Matthew [name], male   DOB: 11/08/1980, 35 y.o.   MRN: [id number] Additional CCM management  Exam: ronchi left greater rt, heart sounds reduced, tachy, s1 s2 , lethargic rass -2, abdo soft, bs low, no edema  Labs all reviewed  A/P 1. MODS 2. Rapidly progressive PNA, culture neg thus far, r/o bacterial, viral 3. ARDS 4. Refractory cardiogenic / septic shock 5. 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 ARF 7. Leukopenia, r/o overwhelming sepsis, theoretically r/o leukemic process   STAT additional access I did limited echo - grossly reduced global; LV dysfxn, dialted EF 10%?, rv fxn down likely poor images, not sig dialted This is a rapid change since am Stat lactic, svo2, consider addition dobutamine Add epi wean vaso off as able CHF consult, ecmo? Peep increase 10 as worsening hypoxia Keep plat less 30, can do permissive hypercapnia if needed Change ceftriaxone to ceftaz for now Even with neg flu, have had multiple last year pos BAL bronch flu, so would KEEP tamiflu and double dose for now, follow renal fxn closely Consider Swan Extensive d/w chf, cvts Send flow cytometry Consider neupogen x 1 dose  Ccm time 100 min additional  Mcarthur Rossettianiel J. Tyson [name], MD, FACP Pgr: [phone] [place] [facility]

## 2015-09-06 NOTE — Progress Notes (Signed)
CRITICAL VALUE ALERT  Critical value received:  Lactic Acid 3.0  Date of notification:  09/06/15  Time of notification:  0140  Critical value read back: Yes  Nurse who received alert:  Marcellina MillinMindy Hopper, RN  MD notified (1st page):  Joneen RoachPaul Hoffman, NP  Time of first page:  Verbal in person  MD notified (2nd page):  Time of second page:  Responding MD:  Joneen RoachPaul Hoffman, NP  Time MD responded:  Same

## 2015-09-06 NOTE — Op Note (Signed)
NAMEVenetia [name] NO.:  [id number]  MEDICAL RECORD NO.:  [id number]  LOCATION:  2M07C                        FACILITY:  [facility]  PHYSICIAN:  Matthew [name], M.D.  DATE OF BIRTH:  07-20-1981  DATE OF PROCEDURE:  09/06/2015 DATE OF DISCHARGE:  09/06/2015                              OPERATIVE REPORT   OPERATION: 1. Cannulation for ECMO with 18-French arterial inflow cannula     inserted into the left common femoral artery via cutdown and a 23-     25-French 2-staged venous cannula placed to the right atrium via     the right common femoral vein. 2. Placement of a distal left femoral artery perfusion catheter     connected to the ECMO arterial inflow circuit for distal perfusion     of the leg.  PREOPERATIVE DIAGNOSES:  Acute viral acute respiratory distress syndrome and myocarditis with shock and acute respiratory failure.  POSTOPERATIVE DIAGNOSES:  Acute viral acute respiratory distress syndrome and myocarditis with shock and acute respiratory failure.  SURGEON:  Matthew Perna, MD  FIRST ASSISTANT:  Matthew [name].  SECOND ASSISTANT:  Matthew [name].  ANESTHESIA:  General by Matthew Rich, MD  INDICATIONS:  The patient is a 35 year old, Afro-American male, who presented to the hospital 24 hours ago with acute viral syndrome with headache, fever, chills, malaise, and shortness of breath.  He quickly deteriorated into ARDS requiring intubation and cardiogenic shock from myocarditis with documented decrease in echo LVEF requiring high-dose pressors.  The patient was discussed with the echo team at [facility] and ECMO was recommended for supporting the patient until the viral syndrome could resolve.  I discussed the situation with the patient's brother in the medical ICU including the plan to move ahead with cannulation for ECMO to support the patient and to allow chance for recover from the acute viral illness.  He understood that the patient would have  surgery at [facility] to place the cannulas for ECMO and then be transported to [facility] for management of the ECMO and further recovery.  I discussed the location of the surgical incisions, the use of general anesthesia, and the risks of bleeding, further progressive shock, left leg ischemia or loss of limb, stroke, and death.  OPERATIVE PROCEDURE:  The patient was brought to the operating room, placed supine on the operating table.  A Swan-Ganz catheter was placed by the anesthesiologist.  A right arterial line was placed by the Anesthesia team.  The patient's chest, abdomen, and legs to the knees were prepped and draped as a sterile field.  A femoral venous line previously placed was removed.  A new 5-French micro introducer was placed in the common right femoral vein.  A previously placed right femoral A-line was withdrawn and pressure was applied.  A transverse incision was made in the left groin crease.  Careful dissection was taken down to identify the common femoral artery.  It was an 18-20 mm vessel.  Heparin was administered 5000 units for an ACT of 165.  Vessel loops were placed around the common femoral artery.  A pledgeted pursestring was placed distally for placement of a distal perfusion  catheter-5-French micro introducer.  This was inserted using the Seldinger technique and secured to the femoral artery with a small tourniquet.  Next, a pursestring of 5-0 Prolene with pledgets was placed superiorly for introduction of the 18-French cannula.  The 18-French cannula was placed through a separate small skin incision below the main incision and using the Seldinger technique and dilators, it was inserted through the skin and directed up into the femoral artery.  It was secured as well with a small tourniquet-keeper.  The side port of the arterial inflow was connected with a [place] tubing to the distal perfusion catheter to maintain distal leg perfusion.   Air was flushed from the system before the connection was made.  The lines from the [employer] ECMO circuit were then placed sterilely on the operative field.  They were connected.  The [id number] connectors were engaged.  All air was purged from the system.  Clamps were removed and [id number] was initiated, gradually increasing flow from 1.5 to 3.5 to 4 L/minute.  There was immediate improvement in arterial oxygen saturation.  Blood gases were checked and found to be significantly improved with pO2 now almost 300, up from 60 on 100% FiO2.  The cannulas were both secured to the skin with heavy silk sutures. Sterile dressings were applied.  [facility].     Matthew PernaPeter Van [name], M.D.     PV/MEDQ  D:  09/06/2015  T:  09/06/2015  Job:  [id number]  cc:   Matthew Bucksaniel J Feinstein, MD Matthew Bucklesaniel R. Bensimhon, MD

## 2015-09-06 NOTE — Progress Notes (Addendum)
ANTICOAGULATION CONSULT NOTE - Initial Consult  Pharmacy Consult for heparin Indication: chest pain/ACS  Allergies  Allergen Reactions  . Penicillins Other (See Comments)    Unknown reaction as a child. Has patient had a PCN reaction causing immediate rash, facial/tongue/throat swelling, SOB or lightheadedness with hypotension: unknown Has patient had a PCN reaction causing severe rash involving mucus membranes or skin necrosis: unknown Has patient had a PCN reaction that required hospitalization: unknown Has patient had a PCN reaction occurring within the last 10 years: Yes If all of the above answers are "NO", then may proceed with Cephalosporin use.     Patient Measurements: Height: 5\' 9"  (175.3 cm) Weight: 130 lb 11.7 oz (59.3 kg) IBW/kg (Calculated) : 70.7  Vital Signs: Temp: 102.7 F (39.3 C) (01/10 1140) Temp Source: Rectal (01/10 1140) BP: 97/76 mmHg (01/10 1245) Pulse Rate: 26 (01/10 1140)  Labs:  Recent Labs  09/05/15 1821  09/05/15 1926 09/06/15 0043 09/06/15 0400 09/06/15 0625 09/06/15 1130 09/06/15 1155  HGB 13.5  --  14.7  --  12.9*  --   --   --   HCT 39.1  --  41.8  --  38.2*  --   --   --   PLT 70*  --  85*  --  131*  --   --   --   APTT  --   --  37  --   --   --   --   --   LABPROT  --   --  17.9*  --   --   --   --   --   INR  --   --  1.47  --   --   --   --   --   CREATININE 1.65*  --  1.60*  --  1.38*  --   --  1.43*  TROPONINI  --   < > 0.07* 0.15*  --  1.40* 26.62*  --   < > = values in this interval not displayed.  Estimated Creatinine Clearance: 61.1 mL/min (by C-G formula based on Cr of 1.43).   Medical History: History reviewed. No pertinent past medical history.  Assessment: 35 yo M admitted 09/05/2015 with chief complaint of dyspnea x1 week and coughing up blood since yesterday. He has had increased work of breathing and was intubated. Pharmacy consulted to start heparin for ACS. Troponin 26.2.   H/H 12.9/38.2, Plt 131, No s/sx  of bleeding noted.  Goal of Therapy:  Heparin level 0.3-0.7 units/ml Monitor platelets by anticoagulation protocol: Yes   Plan:  Give 3500 units bolus x 1 Start heparin infusion at 700 units/hr Check anti-Xa level in 6 hours and daily while on heparin Continue to monitor H&H and platelets  Casilda Carls, PharmD. PGY-1 Pharmacy Resident Pager: [phone]  09/06/2015,1:12 PM  Update: Patient developed more severe shock. Pharmacy consulted to start ceftazidime and check DDIs with amiodarone. Will start ceftazidime now and discontinue ceftriaxone.  Plan:  - Ceftazidime 2g IV q8h - Monitor C&S, renal function and clinical progression  Amiodarone Drug - Drug Interaction Consult Note  Recommendations: Patient is currently not on any medications that interact with amiodarone. Please re-evaluate in the future if additional agents are added.   Amiodarone is metabolized by the cytochrome P450 system and therefore has the potential to cause many drug interactions. Amiodarone has an average plasma half-life of 50 days (range 20 to 100 days).   There is potential for drug interactions to occur  several weeks or months after stopping treatment and the onset of drug interactions may be slow after initiating amiodarone.   []  Statins: Increased risk of myopathy. Simvastatin- restrict dose to 20mg  daily. Other statins: counsel patients to report any muscle pain or weakness immediately.  []  Anticoagulants: Amiodarone can increase anticoagulant effect. Consider warfarin dose reduction. Patients should be monitored closely and the dose of anticoagulant altered accordingly, remembering that amiodarone levels take several weeks to stabilize.  []  Antiepileptics: Amiodarone can increase plasma concentration of phenytoin, the dose should be reduced. Note that small changes in phenytoin dose can result in large changes in levels. Monitor patient and counsel on signs of toxicity.  []  Beta blockers: increased  risk of bradycardia, AV block and myocardial depression. Sotalol - avoid concomitant use.  []   Calcium channel blockers (diltiazem and verapamil): increased risk of bradycardia, AV block and myocardial depression.  []   Cyclosporine: Amiodarone increases levels of cyclosporine. Reduced dose of cyclosporine is recommended.  []  Digoxin dose should be halved when amiodarone is started.  []  Diuretics: increased risk of cardiotoxicity if hypokalemia occurs.  []  Oral hypoglycemic agents (glyburide, glipizide, glimepiride): increased risk of hypoglycemia. Patient's glucose levels should be monitored closely when initiating amiodarone therapy.   []  Drugs that prolong the QT interval:  Torsades de pointes risk may be increased with concurrent use - avoid if possible.  Monitor QTc, also keep magnesium/potassium WNL if concurrent therapy can't be avoided. Marland Kitchen. Antibiotics: e.g. fluoroquinolones, erythromycin. . Antiarrhythmics: e.g. quinidine, procainamide, disopyramide, sotalol. . Antipsychotics: e.g. phenothiazines, haloperidol.  . Lithium, tricyclic antidepressants, and methadone.   Casilda Carlsaylor Jager Koska, PharmD. PGY-1 Pharmacy Resident Pager: [phone] 09/06/2015, 2:36 PM

## 2015-09-06 NOTE — Progress Notes (Signed)
CRITICAL VALUE ALERT  Critical value received: CA  6.1  Date of notification:  09/06/15  Time of notification:  1253  Critical value read back: yes  Nurse who received alert:  1253  MD notified (1st page):  McQuaid  Time of first page:  1253  Time MD responded: 153 At the bedside

## 2015-09-06 NOTE — Procedures (Signed)
Central Venous Catheter Insertion Procedure Note Matthew BearsJay [name] [id number] 10/22/1980  Procedure: Insertion of Central Venous Catheter Indications: Assessment of intravascular volume, Drug and/or fluid administration and Frequent blood sampling  Procedure Details Consent: Risks of procedure as well as the alternatives and risks of each were explained to the (patient/caregiver).  Consent for procedure obtained. Time Out: Verified patient identification, verified procedure, site/side was marked, verified correct patient position, special equipment/implants available, medications/allergies/relevent history reviewed, required imaging and test results available.  Performed  Maximum sterile technique was used including antiseptics, cap, gloves, gown, hand hygiene, mask and sheet. Skin prep: Chlorhexidine; local anesthetic administered A antimicrobial bonded/coated triple lumen catheter was placed in the left internal jugular vein using the Seldinger technique. Ultrasound guidance used.Yes.   Catheter placed to 20 cm. Blood aspirated via all 3 ports and then flushed x 3. Line sutured x 2 and dressing applied.  Evaluation Blood flow good Complications: No apparent complications Patient did tolerate procedure well. Chest X-ray ordered to verify placement.  CXR: pending.  Joneen RoachPaul Hoffman, AGACNP-BC [place] [facility] Pager [phone] or [phone]  09/06/2015 12:46 AM  I was present and supervised then entire procedure.  U/S used in placement.  Alyson ReedyWesam G. Yacoub, M.D. [facility]. Pager: [phone]. After hours pager: [phone].

## 2015-09-06 NOTE — Procedures (Signed)
Bronchoscopy Procedure Note Matthew BearsJay [name] [id number] [birth year]  Procedure: Bronchoscopy Indications: Diagnostic evaluation of the airways, Obtain specimens for culture and/or other diagnostic studies and Remove secretions  Procedure Details Consent: Risks of procedure as well as the alternatives and risks of each were explained to the (patient/caregiver).  Consent for procedure obtained. Time Out: Verified patient identification, verified procedure, site/side was marked, verified correct patient position, special equipment/implants available, medications/allergies/relevent history reviewed, required imaging and test results available.  Performed  In preparation for procedure, patient was given 100% FiO2. Sedation: Benzodiazepines and Fentanyl  Airway entered and the following bronchi were examined: RUL, RML, RLL, LUL, LLL and Bronchi.   Blood noted diffusely throughout the bronchial tree L>R, once suctioned and removed no further bleeding was noted. Bronchoscope removed.    Evaluation Hemodynamic Status: BP stable throughout; O2 sats: stable throughout Patient's Current Condition: stable Specimens:  Sent serosanguinous fluid Complications: No apparent complications Patient did tolerate procedure well.   Matthew [name] 09/06/2015

## 2015-09-06 NOTE — Procedures (Signed)
Central Venous Catheter Insertion Procedure Note Matthew [name] [id number] Mar 02, 1981  Procedure: Insertion of Central Venous Catheter Indications: Drug and/or fluid administration and Frequent blood sampling  Procedure Details Consent: Unable to obtain consent because of emergent medical necessity. Time Out: Verified patient identification, verified procedure, site/side was marked, verified correct patient position, special equipment/implants available, medications/allergies/relevent history reviewed, required imaging and test results available.  Performed  Maximum sterile technique was used including antiseptics, cap, gloves, gown, hand hygiene, mask and sheet. Skin prep: Chlorhexidine; local anesthetic administered A antimicrobial bonded/coated triple lumen catheter was placed in the right femoral vein due to emergent situation using the Seldinger technique.  Evaluation Blood flow good Complications: No apparent complications Patient did tolerate procedure well. Chest X-ray ordered to verify placement.  CXR: normal.  [name],[name] 09/06/2015, 2:10 PM  US Stat Need more access stat  Matthew [name] [name] [name], MD, FACP Pgr: [phone] [place] [facility]

## 2015-09-07 ENCOUNTER — Encounter (HOSPITAL_COMMUNITY): Payer: Self-pay | Admitting: Cardiothoracic Surgery

## 2015-09-07 LAB — PREPARE FRESH FROZEN PLASMA
UNIT DIVISION: 0
UNIT DIVISION: 0
Unit division: 0
Unit division: 0

## 2015-09-07 LAB — TYPE AND SCREEN
ABO/RH(D): O POS
ANTIBODY SCREEN: NEGATIVE
UNIT DIVISION: 0
UNIT DIVISION: 0
Unit division: 0
Unit division: 0

## 2015-09-07 LAB — PREPARE PLATELET PHERESIS
UNIT DIVISION: 0
Unit division: 0

## 2015-09-07 LAB — LEGIONELLA ANTIGEN, URINE

## 2015-09-07 LAB — GLOMERULAR BASEMENT MEMBRANE ANTIBODIES: GBM Ab: 2 U (ref 0–20)

## 2015-09-07 LAB — ANTINUCLEAR ANTIBODIES, IFA: ANTINUCLEAR ANTIBODIES, IFA: NEGATIVE

## 2015-09-07 NOTE — Anesthesia Postprocedure Evaluation (Signed)
Anesthesia Post Note  Patient: Matthew [name]  Procedure(s) Performed: Procedure(s) (LRB): CANNULATION FOR ECMO (N/A) INTRAOPERATIVE TRANSESOPHAGEAL ECHOCARDIOGRAM (N/A)  [facility]. Anesthesia Type: General Level of consciousness: patient remains intubated per anesthesia plan Pain management: pain level controlled Respiratory status: patient remains intubated per anesthesia plan Cardiovascular status: tachycardic and unstable (Multiple vasoactive drips are running) Anesthetic complications: no    Last Vitals:  Filed Vitals:   09/06/15 1500 09/06/15 1515  BP: 122/72   Pulse: 113 116  Temp: 37.9 C   Resp:      Last Pain: There were no vitals filed for this visit.               Malyk Girouard S

## 2015-09-08 ENCOUNTER — Telehealth (HOSPITAL_BASED_OUTPATIENT_CLINIC_OR_DEPARTMENT_OTHER): Payer: Self-pay | Admitting: Emergency Medicine

## 2015-09-08 LAB — CULTURE, RESPIRATORY W GRAM STAIN

## 2015-09-08 LAB — RESPIRATORY VIRUS PANEL
ADENOVIRUS: NEGATIVE
INFLUENZA A: POSITIVE — AB
INFLUENZA B 1: NEGATIVE
Metapneumovirus: NEGATIVE
PARAINFLUENZA 2 A: NEGATIVE
PARAINFLUENZA 3 A: NEGATIVE
Parainfluenza 1: NEGATIVE
RESPIRATORY SYNCYTIAL VIRUS B: NEGATIVE
RHINOVIRUS: NEGATIVE
Respiratory Syncytial Virus A: NEGATIVE

## 2015-09-08 LAB — CULTURE, BAL-QUANTITATIVE W GRAM STAIN: Special Requests: NORMAL

## 2015-09-08 LAB — CULTURE, BAL-QUANTITATIVE

## 2015-09-08 LAB — MPO/PR-3 (ANCA) ANTIBODIES: Myeloperoxidase Abs: <9 U/mL (ref 0.0–9.0)

## 2015-09-08 LAB — CULTURE, RESPIRATORY

## 2015-09-10 LAB — CULTURE, BLOOD (ROUTINE X 2)
CULTURE: NO GROWTH
Culture: NO GROWTH

## 2015-09-30 LAB — FUNGUS CULTURE W SMEAR
FUNGAL SMEAR: NONE SEEN
SPECIAL REQUESTS: NORMAL

## 2015-10-10 NOTE — Discharge Summary (Signed)
Name: Matthew [name] MRN: [id number] DOB: Jan 05, 1981 35 y.o. PCP: No Pcp Per Patient  Date of Admission: 09/05/2015  3:58 PM Date of Discharge: 1//10/17 Attending Physician: Matthew Leash, MD  Discharge Diagnosis: 1. Acute viral ARDS and myocarditis with shock and acute respiratory failure Active Problems:   Acute respiratory failure (HCC)   Acute respiratory failure with hypoxia (HCC)   Community acquired pneumonia   Lactic acidosis   AKI (acute kidney injury) (HCC)  Discharge Medications:   Medication List    ASK your doctor about these medications        azithromycin 250 MG tablet  Commonly known as:  ZITHROMAX  Take 1 tablet (250 mg total) by mouth daily. Take first 2 tablets together, then 1 every day until finished.     benzonatate 100 MG capsule  Commonly known as:  TESSALON  Take 1 capsule (100 mg total) by mouth every 8 (eight) hours.     dicyclomine 20 MG tablet  Commonly known as:  BENTYL  Take 1 tablet (20 mg total) by mouth 2 (two) times daily.     ondansetron 4 MG disintegrating tablet  Commonly known as:  ZOFRAN ODT  Take 1 tablet (4 mg total) by mouth every 8 (eight) hours as needed for nausea or vomiting.        Disposition and follow-up:   Matthew [name] was discharged from [facility] in Serious condition and transferred to [facility] for ECMO management.  Consultations: Treatment Team:  Rounding Lbcardiology, MD Matthew Perna, MD  Procedures Performed:  1. Cannulation for ECMO with 18-French arterial inflow cannula  inserted into the left common femoral artery via cutdown and a 23-  25-French 2-staged venous cannula placed to the right atrium via  the right common femoral vein. 2. Placement of a distal left femoral artery perfusion catheter  connected to the ECMO arterial inflow circuit for distal perfusion  of the leg.  2D Echo: TTE 09/06/15 - Marked sinus tachycardia. Normal LV size and systolic  function, EF 55-60%. No definite wall motion abnormalities but difficult images. The RV appeared normal in size and systolic function. However, there did appear to be a mild D-shape to the interventricular septum, suggesting a degree of RV pressure/volume overload. The IVC was not dilated, however.  TEE 09/06/15 - Left ventricle: Systolic function was moderately reduced. The estimated ejection fraction was in the range of 35% to 40%. - Left atrium: No evidence of thrombus in the atrial cavity or appendage. - Right atrium: No evidence of thrombus in the atrial cavity or appendage.  Cardiac Cath: n/a  Admission HPI: Patient is a 35 year old male with no significant past medical history presenting to [facility] emergency room complaining of dyspnea for 1 week and coughing up blood since yesterday. Patient had increased work of breathing and was coughing up blood. He could barely speak. Remaining history was obtained from emergency room documentation. As per emergency room documentation, he reported not feeling any better despite taking antibiotics for recently diagnosed pneumonia. He was found to have abnormal lactic acid (5.79) and code sepsis was called. Patient was found to have temperature 102.9, HR 115, RR 22, white blood cell 1.5, and serum creatinine 1.8. He was started on Vanco and Zosyn in the ED for coverage of pneumococcal community-acquired pneumonia. Patient had increased work of breathing and was coughing up blood, as such, he was intubated in the ED  Patient presented to the [facility] emergency room yesterday 09/04/2015  with body aches, nausea, abdominal pain, subjective fever, and headache for the last 4 days. He endorsed vomiting during the first 2 days of his illness. Denied having any sick contacts. Reported having abdominal pain which is periumbilical, 10/10 intensity, "feels like a knot," nonradiating. Reported having headache which was bilateral, "all over,"  rated 10/10, throbbing, nonradiating. Patient had not taken any medications. He denied chills, diarrhea/constipation, chest pain, shortness of breath, or any pain or complaints. He later reported to the physician there that he was having nonproductive cough over the last 2-3 days.Patient was deemed to have possible viral illness and discharged the same day.   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 Patient was intubated on mechanical ventilation with difficult vent synchrony requiring paralytic. Bedside chest U/S of left hemithroax performed with consolidated lung seen, only trace effusion noted. Bedside TTE repeated showing LVEF 20% with severe RV hypokinesis compared to EF 55-60% earlier that morning. Patient was on full dose levophed, dobutamine 2.5, vasopressin and epinephrine. Patient was on broad coverage antibiotics and tamiflu. HR in 140s. SBP 70-100. Runs NSVT. Troponin 1.4-> 27. Lactate 3.0. CXR with progressive severe bilateral air space disease. EKG showed low voltage throughout without acute ischemic changes. Cardiology, Cardiothoracic surgery team, and [facility] ECMO team were consulted and patient was taken to OR for emergent ECMO cannulation and transfer to [facility].  After discharge, microbiology revealed abundant MRSA from BAL and Respiratory viral panel was positive for Influenza A (negative on flu panel PCR).   Discharge Vitals:   BP 122/72 mmHg  Pulse 116  Temp(Src) 100.2 F (37.9 C) (Rectal)  Resp 26  Ht  (1.753 m)  Wt 130 lb 11.7 oz (59.3 kg)  BMI 19.30 kg/m2  SpO2 100%  Discharge Labs:  No results found for this or any previous visit (from the  past 24 hour(s)).  Signed: Darreld Mclean, MD 10/10/2015, 11:08 AM    Services Ordered on Discharge: Transfer to [facility] Equipment Ordered on Discharge: ECMO

## 2015-10-19 LAB — AFB CULTURE WITH SMEAR (NOT AT ARMC)
Acid Fast Smear: NONE SEEN
SPECIAL REQUESTS: NORMAL

## 2016-03-22 ENCOUNTER — Encounter (HOSPITAL_COMMUNITY): Payer: Self-pay | Admitting: Emergency Medicine

## 2016-03-22 ENCOUNTER — Emergency Department (HOSPITAL_COMMUNITY)
Admission: EM | Admit: 2016-03-22 | Discharge: 2016-03-22 | Disposition: A | Discharge status: Home / Self Care | Payer: Medicaid Other | Attending: Emergency Medicine | Admitting: Emergency Medicine

## 2016-03-22 ENCOUNTER — Emergency Department (HOSPITAL_COMMUNITY): Payer: Medicaid Other

## 2016-03-22 DIAGNOSIS — S6992XA Unspecified injury of left wrist, hand and finger(s), initial encounter: Secondary | ICD-10-CM | POA: Diagnosis present

## 2016-03-22 DIAGNOSIS — N342 Other urethritis: Secondary | ICD-10-CM

## 2016-03-22 DIAGNOSIS — X58XXXA Exposure to other specified factors, initial encounter: Secondary | ICD-10-CM | POA: Insufficient documentation

## 2016-03-22 DIAGNOSIS — I96 Gangrene, not elsewhere classified: Secondary | ICD-10-CM

## 2016-03-22 DIAGNOSIS — Y939 Activity, unspecified: Secondary | ICD-10-CM | POA: Diagnosis not present

## 2016-03-22 DIAGNOSIS — Y929 Unspecified place or not applicable: Secondary | ICD-10-CM | POA: Diagnosis not present

## 2016-03-22 DIAGNOSIS — Y999 Unspecified external cause status: Secondary | ICD-10-CM | POA: Insufficient documentation

## 2016-03-22 DIAGNOSIS — T1490XA Injury, unspecified, initial encounter: Secondary | ICD-10-CM

## 2016-03-22 LAB — CBC
HCT: 37.5 % — ABNORMAL LOW (ref 39.0–52.0)
Hemoglobin: 12.5 g/dL — ABNORMAL LOW (ref 13.0–17.0)
MCH: 29.5 pg (ref 26.0–34.0)
MCHC: 33.3 g/dL (ref 30.0–36.0)
MCV: 88.4 fL (ref 78.0–100.0)
PLATELETS: 232 10*3/uL (ref 150–400)
RBC: 4.24 MIL/uL (ref 4.22–5.81)
RDW: 12.9 % (ref 11.5–15.5)
WBC: 3.8 10*3/uL — ABNORMAL LOW (ref 4.0–10.5)

## 2016-03-22 MED ORDER — AZITHROMYCIN 250 MG PO TABS
1000.0000 mg | ORAL_TABLET | Freq: Once | ORAL | Status: AC
  Administered 2016-03-22: 1000 mg via ORAL
  Filled 2016-03-22: qty 4

## 2016-03-22 MED ORDER — CEFTRIAXONE SODIUM 250 MG IJ SOLR
250.0000 mg | Freq: Once | INTRAMUSCULAR | Status: AC
  Administered 2016-03-22: 250 mg via INTRAMUSCULAR
  Filled 2016-03-22: qty 250

## 2016-03-22 MED ORDER — STERILE WATER FOR INJECTION IJ SOLN
INTRAMUSCULAR | Status: AC
  Administered 2016-03-22: 10 mL
  Filled 2016-03-22: qty 10
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
## 2016-03-22 NOTE — ED Notes (Signed)
Patient transported to X-ray 

## 2016-03-22 NOTE — ED Provider Notes (Signed)
[facility]-EMERGENCY DEPT Provider Note   CSN: [id number] Arrival date & time: 03/22/16  1025  First Provider Contact:  First MD Initiated Contact with Patient 03/22/16 1038        History   Chief Complaint Chief Complaint  Patient presents with  . Finger Injury    HPI Matthew [name] is a 35 y.o. male.  HPI Patient presents the emergency department today with complaints of mild increasing pain at his left thumb and left index finger.  He was treated for ARDS and was on ECMO and high-dose pressors at [facility] in April 2017.  When he finally came back to an recovered from his severe illness he had necrosis of several toes and fingers.  The only fingers that remain necrotic or his left thumb in his left index finger which appear to be self amputating at this time.  He reports they've had mild increasing pain.  He denies redness.  He denies fevers and chills.  He denies pain further back in his hand.     Patient Active Problem List   Diagnosis Date Noted  . Acute respiratory failure with hypoxia (HCC)   . Community acquired pneumonia   . Lactic acidosis   . AKI (acute kidney injury) (HCC)   . Acute respiratory failure (HCC) 09/05/2015    Past Surgical History:  Procedure Laterality Date  . CANNULATION FOR CARDIOPULMONARY BYPASS N/A 09/06/2015   Procedure: CANNULATION FOR ECMO;  Surgeon: Kerin Perna, MD;  Location: [facility] OR;  Service: Open Heart Surgery;  Laterality: N/A;  . INTRAOPERATIVE TRANSESOPHAGEAL ECHOCARDIOGRAM N/A 09/06/2015   Procedure: INTRAOPERATIVE TRANSESOPHAGEAL ECHOCARDIOGRAM;  Surgeon: Kerin Perna, MD;  Location: [facility] OR;  Service: Open Heart Surgery;  Laterality: N/A;       Home Medications    Prior to Admission medications   Medication Sig Start Date End Date Taking? Authorizing Provider  None                               Family History History reviewed. No pertinent family history.  Social History Social History  Substance Use Topics  .  Smoking status: Not on file  . Smokeless tobacco: Never Used  . Alcohol use Not on file     Allergies   Penicillins   Review of Systems Review of Systems  All other systems reviewed and are negative.    Physical Exam Updated Vital Signs BP 100/61   Pulse 77   Temp 98.2 F (36.8 C) (Oral)   SpO2 99%   Physical Exam  Constitutional: He is oriented to person, place, and time. He appears well-developed and well-nourished.  HENT:  Head: Normocephalic.  Eyes: EOM are normal.  Neck: Normal range of motion.  Pulmonary/Chest: Effort normal.  Abdominal: He exhibits no distension.  Musculoskeletal:  Distal phalanx of left thumb and left index finger are obviously dead with evidence of dry gangrene.  Both the fingers have exposed distal phalanx.  There is no expressible discharge present at the distal of either viable finger  Neurological: He is alert and oriented to person, place, and time.  Psychiatric: He has a normal mood and affect.  Nursing note and vitals reviewed.    ED Treatments / Results  Labs (all labs ordered are listed, but only abnormal results are displayed) Labs Reviewed  CBC - Abnormal; Notable for the following:       Result Value   WBC 3.8 (*)  Hemoglobin 12.5 (*)    HCT 37.5 (*)    All other components within normal limits    EKG  EKG Interpretation None       Radiology Dg Hand Complete Left  Result Date: 03/22/2016 CLINICAL DATA:  Evaluate dry gangrene. EXAM: LEFT HAND - COMPLETE 3+ VIEW COMPARISON:  None. FINDINGS: Soft tissue defects at the first and second digits, consistent with dry gangrene. Cortical defects in the middle phalanx of the second digit and distal phalanx of the first digit correspond to the soft tissue loss, without pathologic fracture. IMPRESSION: Dry and gangrene of the first and second fingers. Osseous changes are associated. Electronically Signed   By: Elsie Stain M.D.   On: 03/22/2016 11:51   Procedures Procedures  (including critical care time)  Medications Ordered in ED Medications - No data to display   Initial Impression / Assessment and Plan / ED Course  I have reviewed the triage vital signs and the nursing notes.  Pertinent labs & imaging results that were available during my care of the patient were reviewed by me and considered in my medical decision making (see chart for details).  Clinical Course    Fingertips appear to be self amputating at this time.  X-ray demonstrates no signs of osteomyelitis.  White count is normal.  I cannot express any discharge or pus from the wounds themselves.  I discussed the case with my on-call orthopedist Dr. Renaye Rakers who recommends following up with an orthopedic hand surgeon.  He recommends Dr. Janee Morn for follow-up as he agrees these will need to be revised at some point.  Contact information for Dr. Janee Morn given to the patient.  Final Clinical Impressions(s) / ED Diagnoses   Final diagnoses:  Injury    New Prescriptions New Prescriptions   No medications on file     Azalia Bilis, MD 03/22/16 1227

## 2016-03-22 NOTE — ED Notes (Signed)
Pt was put up for d/c, but when attempting to d/c pt he asks for a STD test. Dr. Patria Mane to collect.

## 2016-03-22 NOTE — ED Notes (Signed)
Pt is in stable condition upon d/c and ambulates from ED. 

## 2016-03-23 LAB — GC/CHLAMYDIA PROBE AMP (~~LOC~~) NOT AT ARMC
Chlamydia: NEGATIVE
Neisseria Gonorrhea: POSITIVE — AB

## 2016-03-26 ENCOUNTER — Telehealth (HOSPITAL_COMMUNITY): Payer: Self-pay

## 2016-03-26 NOTE — Telephone Encounter (Signed)
Positive for gonorrhea. Treated per protocol. [employer] form faxed. Attempting to contact. Unable to reach by telephone. Letter sent to address on record.  

## 2016-06-13 ENCOUNTER — Telehealth (HOSPITAL_BASED_OUTPATIENT_CLINIC_OR_DEPARTMENT_OTHER): Payer: Self-pay | Admitting: Emergency Medicine

## 2016-06-13 NOTE — Telephone Encounter (Signed)
Lost to followup 

## 2016-06-21 ENCOUNTER — Emergency Department (HOSPITAL_COMMUNITY)
Admission: EM | Admit: 2016-06-21 | Discharge: 2016-06-21 | Disposition: A | Discharge status: Home / Self Care | Payer: Medicaid Other | Attending: Emergency Medicine | Admitting: Emergency Medicine

## 2016-06-21 ENCOUNTER — Encounter (HOSPITAL_COMMUNITY): Payer: Self-pay | Admitting: Emergency Medicine

## 2016-06-21 DIAGNOSIS — Z4889 Encounter for other specified surgical aftercare: Secondary | ICD-10-CM

## 2016-06-21 DIAGNOSIS — Z4801 Encounter for change or removal of surgical wound dressing: Secondary | ICD-10-CM | POA: Diagnosis not present

## 2016-06-21 NOTE — ED Provider Notes (Signed)
[facility] Provider Note   CSN: [id number] Arrival date & time: 06/21/16  1157   By signing my name below, I, Freida Busman, attest that this documentation has been prepared under the direction and in the presence of non-physician practitioner, Jaynie Crumble, PA-C. Electronically Signed: Freida Busman, Scribe. 06/21/2016. 1:30 PM.   History   Chief Complaint Chief Complaint  Patient presents with  . Finger Injury    The history is provided by the patient. No language interpreter was used.     HPI Comments:  Matthew [name] is a 35 y.o. male who presents to the Emergency Department complaining of bleeding from the site of his partial left index finger amputation, which he first noticed this AM. He also notes continued pain to the site. Pt had partial amputations of the left index finger and thumb on 06/07/16. He was instructed to follow up with wound care on 06/28/16 but states he was not advised on how to adequately care for the wound until that time. He has changed the dressing once since surgery. No fever. Pt has no other complaints or symptoms at this time.    History reviewed. No pertinent past medical history.  Patient Active Problem List   Diagnosis Date Noted  . Acute respiratory failure with hypoxia (HCC)   . Community acquired pneumonia   . Lactic acidosis   . AKI (acute kidney injury) (HCC)   . Acute respiratory failure (HCC) 09/05/2015    Past Surgical History:  Procedure Laterality Date  . CANNULATION FOR CARDIOPULMONARY BYPASS N/A 09/06/2015   Procedure: CANNULATION FOR ECMO;  Surgeon: Kerin Perna, MD;  Location: [facility] OR;  Service: Open Heart Surgery;  Laterality: N/A;  . HAND SURGERY    . INTRAOPERATIVE TRANSESOPHAGEAL ECHOCARDIOGRAM N/A 09/06/2015   Procedure: INTRAOPERATIVE TRANSESOPHAGEAL ECHOCARDIOGRAM;  Surgeon: Kerin Perna, MD;  Location: [facility] OR;  Service: Open Heart Surgery;  Laterality: N/A;       Home Medications    Prior to  Admission medications   Not on File    Family History History reviewed. No pertinent family history.  Social History Social History  Substance Use Topics  . Smoking status: Never Smoker  . Smokeless tobacco: Never Used  . Alcohol use No     Allergies   Penicillins   Review of Systems Review of Systems  Constitutional: Negative for fever.  Skin: Positive for wound.     Physical Exam Updated Vital Signs BP 106/70 (BP Location: Right Arm)   Pulse 82   Temp 98.5 F (36.9 C) (Oral)   Resp 18   Ht 5\' 9"  (1.753 m)   Wt 130 lb (59 kg)   SpO2 98%   BMI 19.20 kg/m   Physical Exam  Constitutional: He is oriented to person, place, and time. He appears well-developed and well-nourished. No distress.  HENT:  Head: Normocephalic and atraumatic.  Eyes: Conjunctivae are normal.  Cardiovascular: Normal rate.   Pulmonary/Chest: Effort normal.  Abdominal: He exhibits no distension.  Musculoskeletal:  Amputation of distal left thumb and left index finger. Sutures intact. Wounds dry. No drainage. No bleeding. Some macerated skin noted over middle finger. Pt reports wet drainage.   Neurological: He is alert and oriented to person, place, and time.  Skin: Skin is warm and dry.  Psychiatric: He has a normal mood and affect.  Nursing note and vitals reviewed.    ED Treatments / Results  DIAGNOSTIC STUDIES:  Oxygen Saturation is 98% on RA, normal by my interpretation.  COORDINATION OF CARE:  1:02 PM Discussed treatment plan with pt at bedside and pt agreed to plan.  Labs (all labs ordered are listed, but only abnormal results are displayed) Labs Reviewed - No data to display  EKG  EKG Interpretation None       Radiology No results found.  Procedures Procedures (including critical care time)  Medications Ordered in ED Medications - No data to display   Initial Impression / Assessment and Plan / ED Course  I have reviewed the triage vital signs and the  nursing notes.  Pertinent labs & imaging results that were available during my care of the patient were reviewed by me and  considered in my medical decision making (see chart for details).  Clinical Course   Patient is here for wound check, he had partial amputation of the left index and thumb [facility]07[facility]. He is here because the wound had minimal bleeding. On exam, patient's wounds were covered in wet dirty dressing and 1 even had electrical tape over it. Patient stated he did not have any dressings at home. One of the wounds is clearly macerated and looks like has been wet for a long time. There is however no evidence of infection, no drainage or bleeding at this time. No evidence of wound adhesion. Wounds redressed. Patient given some supplies for wound dressings at home. Instructed to follow-up. He has an appointment in 1 week with wound care.  Final Clinical Impressions(s) / ED Diagnoses   Final diagnoses:  Encounter for post surgical wound check    New Prescriptions New Prescriptions   No medications on file   I personally performed the services described in this documentation, which was scribed in my presence. The recorded information has been reviewed and is accurate.     Jaynie Crumbleatyana Tyaisha Cullom, PA-C 06/21/16 1340    Alvira MondayErin Schlossman, MD 06/21/16 [phone]

## 2016-06-21 NOTE — ED Notes (Signed)
Papers reviewed and patient verbalizes understanding and intent to follow up with wound appointment

## 2016-06-21 NOTE — Discharge Instructions (Signed)
Continue changing dressings daily or if soiled or wet. Follow-up with wound care scheduled. Return if any concerning symptoms.

## 2016-06-21 NOTE — ED Triage Notes (Addendum)
Had surgery on thumb and index finger of left hand for dry gangrene on Oct 12. Noted bleeding under bandage this morning. No known injury to it. Has not removed bandage to see what it looks like. Supposed to follow up on Nov 2 for suture removal. States he can't remember who did the surgery, so he came to the ED.

## 2016-06-21 NOTE — ED Notes (Signed)
Pt. sts he is here to have his fingers rewrapped because they are bleeding through the bandages and doesn't feel he can make it to the wound care appointment on 11/2

## 2017-01-14 IMAGING — DX DG HAND COMPLETE 3+V*L*
3 series · 3 of 3 positions shown · non-contrast
Comparison: None.

CLINICAL DATA: Evaluate dry gangrene.

EXAM:
LEFT HAND - COMPLETE 3+ VIEW

[x hand pa left]
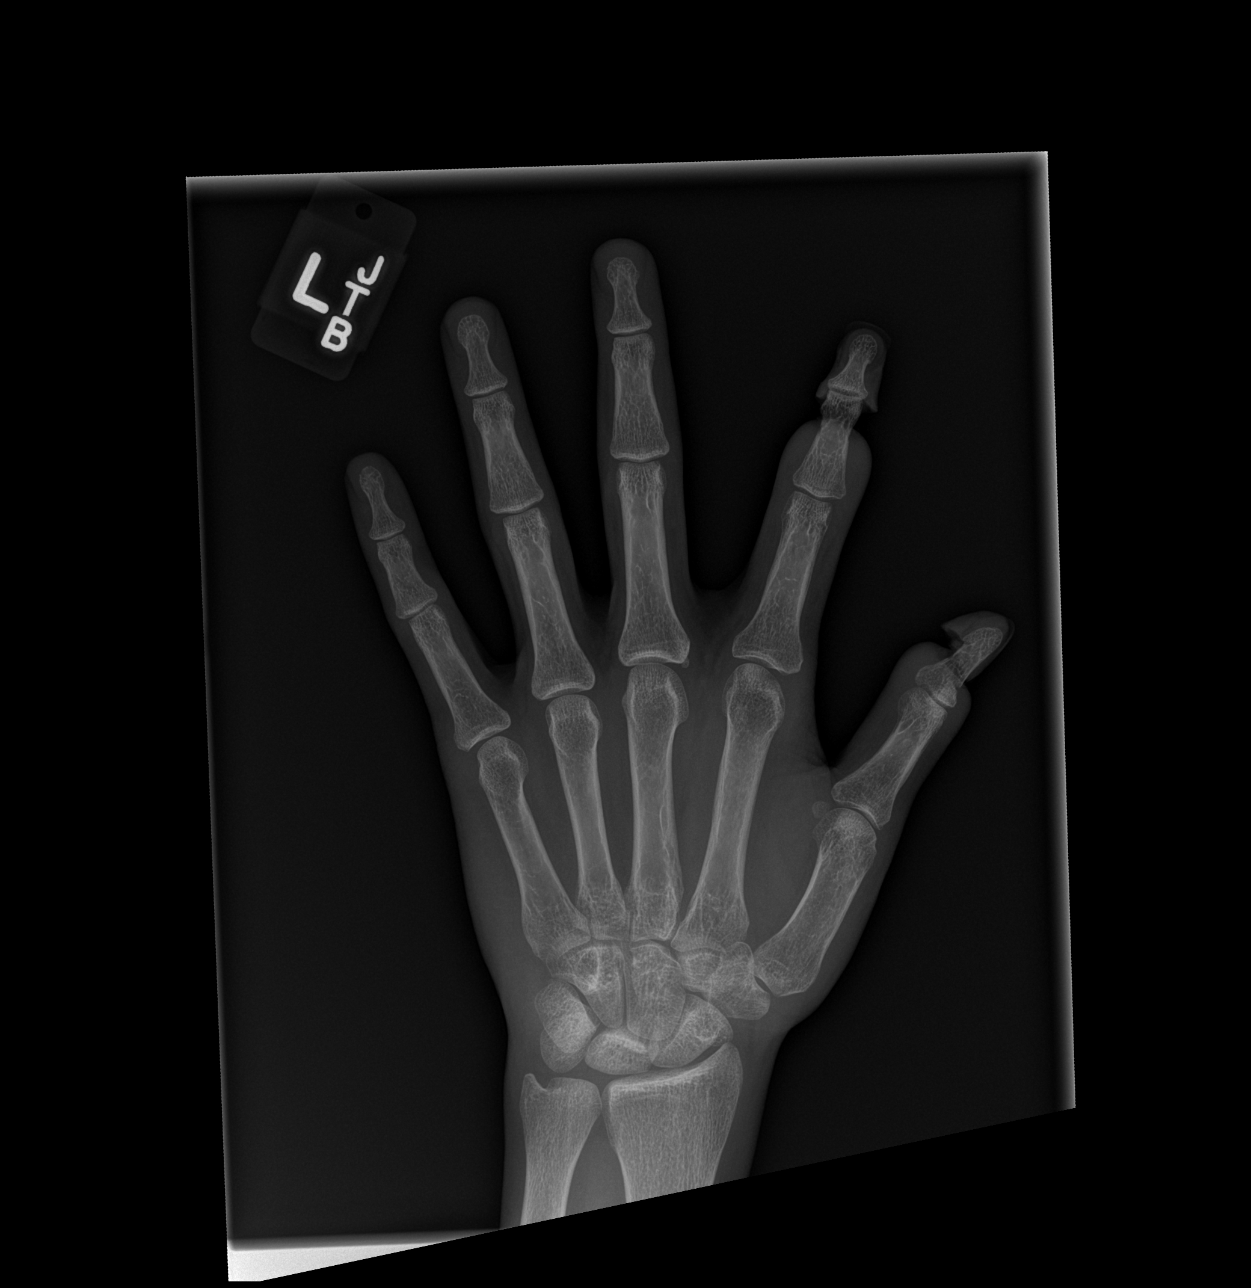

[x hand obl left]
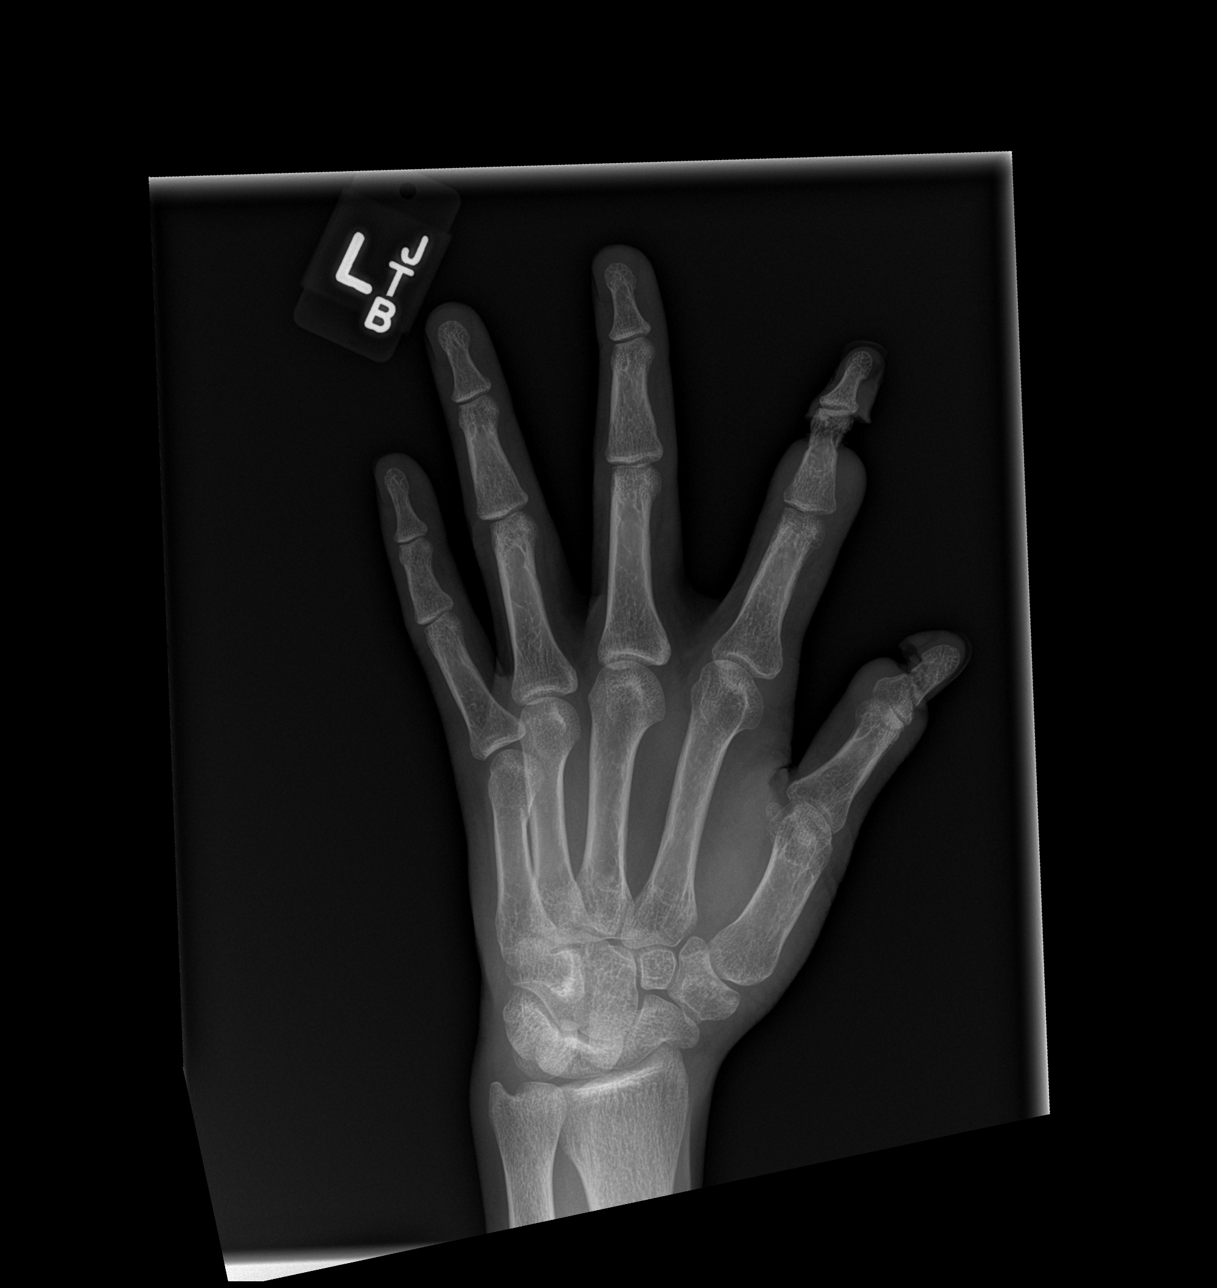

[x hand lat left]
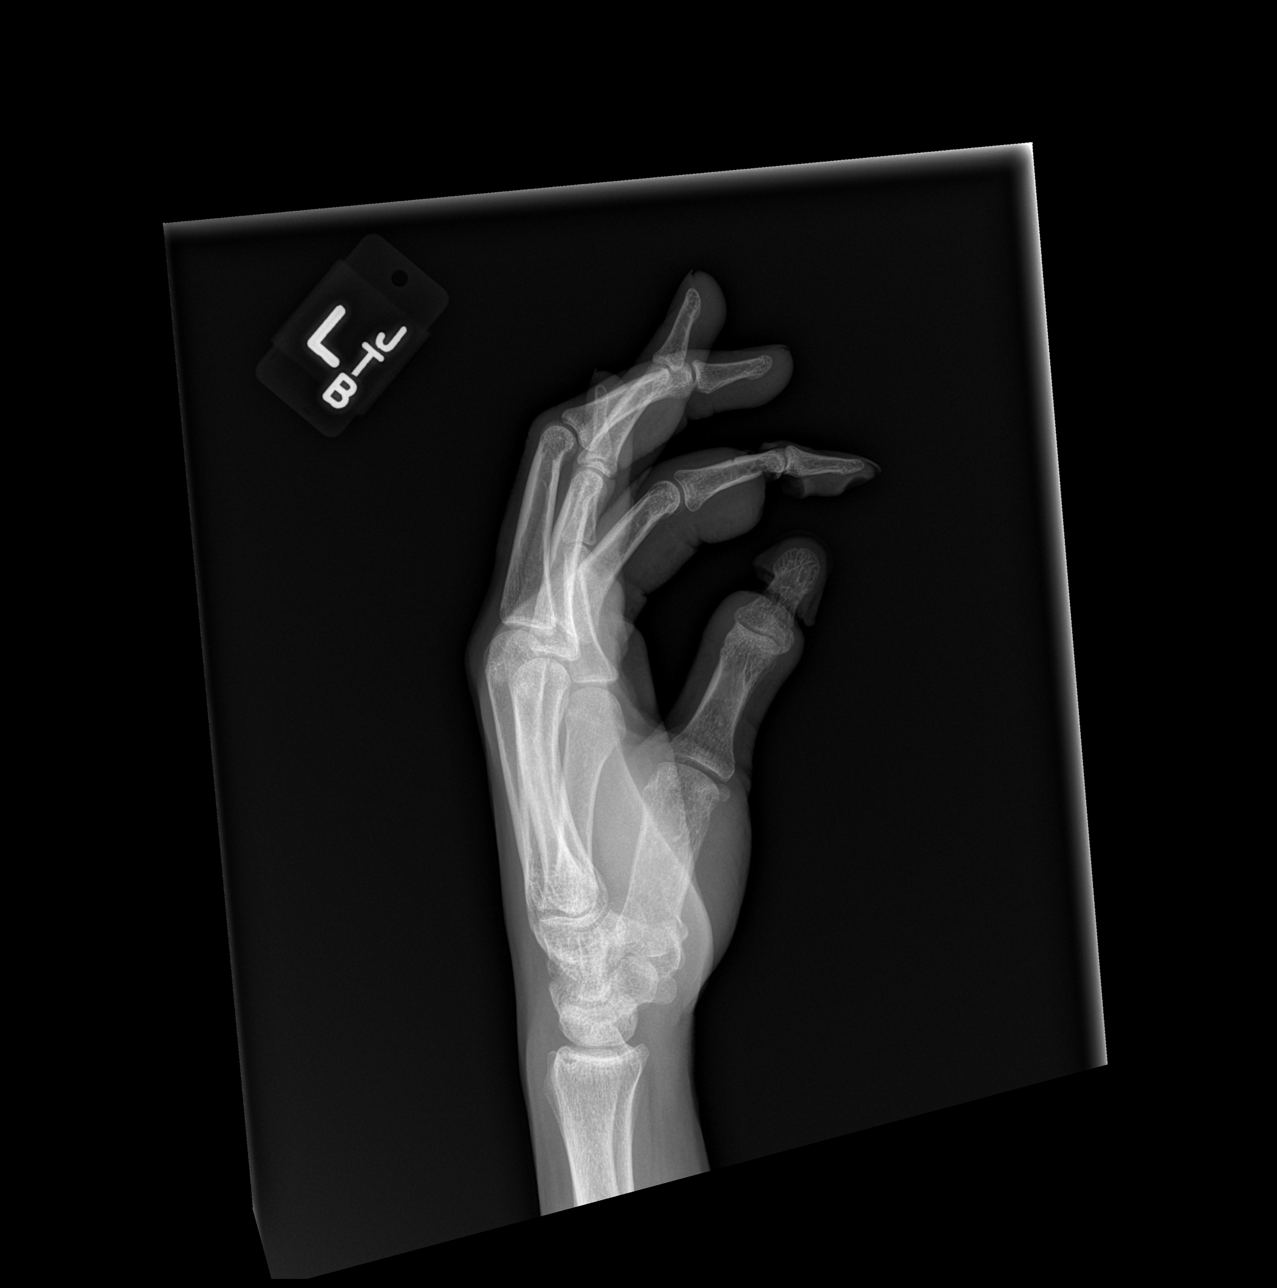

[3 of 3 positions shown; findings below may reference images not displayed]

FINDINGS: Soft tissue defects at the first and second digits, consistent with
dry gangrene. Cortical defects in the middle phalanx of the second
digit and distal phalanx of the first digit correspond to the soft
tissue loss, without pathologic fracture.
IMPRESSION: Dry and gangrene of the first and second fingers. Osseous changes
are associated.

## 2022-05-04 ENCOUNTER — Other Ambulatory Visit: Payer: Self-pay

## 2022-05-04 ENCOUNTER — Encounter (HOSPITAL_BASED_OUTPATIENT_CLINIC_OR_DEPARTMENT_OTHER): Payer: Self-pay

## 2022-05-04 ENCOUNTER — Emergency Department (HOSPITAL_BASED_OUTPATIENT_CLINIC_OR_DEPARTMENT_OTHER): Payer: No Typology Code available for payment source

## 2022-05-04 DIAGNOSIS — S161XXA Strain of muscle, fascia and tendon at neck level, initial encounter: Secondary | ICD-10-CM | POA: Insufficient documentation

## 2022-05-04 DIAGNOSIS — S46911A Strain of unspecified muscle, fascia and tendon at shoulder and upper arm level, right arm, initial encounter: Secondary | ICD-10-CM | POA: Diagnosis not present

## 2022-05-04 DIAGNOSIS — S4991XA Unspecified injury of right shoulder and upper arm, initial encounter: Secondary | ICD-10-CM | POA: Diagnosis present

## 2022-05-04 DIAGNOSIS — S39012A Strain of muscle, fascia and tendon of lower back, initial encounter: Secondary | ICD-10-CM | POA: Insufficient documentation

## 2022-05-04 DIAGNOSIS — Y92410 Unspecified street and highway as the place of occurrence of the external cause: Secondary | ICD-10-CM | POA: Diagnosis not present
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
## 2022-05-04 NOTE — ED Triage Notes (Signed)
Pt refuses c -collar

## 2022-05-05 ENCOUNTER — Emergency Department (HOSPITAL_BASED_OUTPATIENT_CLINIC_OR_DEPARTMENT_OTHER)
Admission: EM | Admit: 2022-05-05 | Discharge: 2022-05-05 | Disposition: A | Discharge status: Home / Self Care | Payer: No Typology Code available for payment source | Attending: Emergency Medicine | Admitting: Emergency Medicine

## 2022-05-05 DIAGNOSIS — S39012A Strain of muscle, fascia and tendon of lower back, initial encounter: Secondary | ICD-10-CM

## 2022-05-05 DIAGNOSIS — S161XXA Strain of muscle, fascia and tendon at neck level, initial encounter: Secondary | ICD-10-CM

## 2022-05-05 DIAGNOSIS — S46911A Strain of unspecified muscle, fascia and tendon at shoulder and upper arm level, right arm, initial encounter: Secondary | ICD-10-CM
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
## 2022-05-05 NOTE — ED Notes (Signed)
Written and verbal inst to pt  Verbalized an understanding  To home  

## 2022-08-06 ENCOUNTER — Other Ambulatory Visit: Payer: Self-pay

## 2022-08-06 ENCOUNTER — Emergency Department (HOSPITAL_BASED_OUTPATIENT_CLINIC_OR_DEPARTMENT_OTHER)
Admission: EM | Admit: 2022-08-06 | Discharge: 2022-08-06 | Disposition: A | Discharge status: Home / Self Care | Payer: Medicaid Other | Attending: Emergency Medicine | Admitting: Emergency Medicine

## 2022-08-06 ENCOUNTER — Emergency Department (HOSPITAL_BASED_OUTPATIENT_CLINIC_OR_DEPARTMENT_OTHER): Payer: Medicaid Other

## 2022-08-06 ENCOUNTER — Encounter (HOSPITAL_BASED_OUTPATIENT_CLINIC_OR_DEPARTMENT_OTHER): Payer: Self-pay | Admitting: Emergency Medicine

## 2022-08-06 DIAGNOSIS — R1013 Epigastric pain: Secondary | ICD-10-CM | POA: Insufficient documentation

## 2022-08-06 DIAGNOSIS — R110 Nausea: Secondary | ICD-10-CM | POA: Insufficient documentation

## 2022-08-06 HISTORY — DX: Gangrene, not elsewhere classified: I96

## 2022-08-06 HISTORY — DX: Personal history of other diseases of the respiratory system: Z87.09

## 2022-08-06 LAB — CBC WITH DIFFERENTIAL/PLATELET
Abs Immature Granulocytes: 0.01 10*3/uL (ref 0.00–0.07)
Basophils Absolute: 0 10*3/uL (ref 0.0–0.1)
Basophils Relative: 1 %
Eosinophils Absolute: 0.1 10*3/uL (ref 0.0–0.5)
Eosinophils Relative: 1 %
HCT: 43.2 % (ref 39.0–52.0)
Hemoglobin: 14.3 g/dL (ref 13.0–17.0)
Immature Granulocytes: 0 %
Lymphocytes Relative: 25 %
Lymphs Abs: 2.2 10*3/uL (ref 0.7–4.0)
MCH: 30.4 pg (ref 26.0–34.0)
MCHC: 33.1 g/dL (ref 30.0–36.0)
MCV: 91.9 fL (ref 80.0–100.0)
Monocytes Absolute: 0.9 10*3/uL (ref 0.1–1.0)
Monocytes Relative: 11 %
Neutro Abs: 5.4 10*3/uL (ref 1.7–7.7)
Neutrophils Relative %: 62 %
Platelets: 195 10*3/uL (ref 150–400)
RBC: 4.7 MIL/uL (ref 4.22–5.81)
RDW: 12.5 % (ref 11.5–15.5)
WBC: 8.6 10*3/uL (ref 4.0–10.5)
nRBC: 0 % (ref 0.0–0.2)

## 2022-08-06 LAB — COMPREHENSIVE METABOLIC PANEL
ALT: 13 U/L (ref 0–44)
AST: 19 U/L (ref 15–41)
Albumin: 4.1 g/dL (ref 3.5–5.0)
Alkaline Phosphatase: 73 U/L (ref 38–126)
Anion gap: 8 (ref 5–15)
BUN: 18 mg/dL (ref 6–20)
CO2: 26 mmol/L (ref 22–32)
Calcium: 9 mg/dL (ref 8.9–10.3)
Chloride: 103 mmol/L (ref 98–111)
Creatinine, Ser: 1.23 mg/dL (ref 0.61–1.24)
GFR, Estimated: >60 mL/min (ref >60)
Glucose, Bld: 118 mg/dL — ABNORMAL HIGH (ref 70–99)
Potassium: 3.6 mmol/L (ref 3.5–5.1)
Sodium: 137 mmol/L (ref 135–145)
Total Bilirubin: 0.6 mg/dL (ref 0.3–1.2)
Total Protein: 7.9 g/dL (ref 6.5–8.1)

## 2022-08-06 LAB — HIV ANTIBODY (ROUTINE TESTING W REFLEX): HIV Screen 4th Generation wRfx: NONREACTIVE

## 2022-08-06 LAB — URINALYSIS, ROUTINE W REFLEX MICROSCOPIC
Bilirubin Urine: NEGATIVE
Glucose, UA: NEGATIVE mg/dL
Ketones, ur: NEGATIVE mg/dL
Nitrite: NEGATIVE
Protein, ur: 30 mg/dL — AB
Specific Gravity, Urine: >=1.03 (ref 1.005–1.030)
pH: 6 (ref 5.0–8.0)

## 2022-08-06 LAB — URINALYSIS, MICROSCOPIC (REFLEX)

## 2022-08-06 LAB — RPR: RPR Ser Ql: NONREACTIVE

## 2022-08-06 LAB — LIPASE, BLOOD: Lipase: 36 U/L (ref 11–51)

## 2022-08-06 MED ORDER — OMEPRAZOLE 40 MG PO CPDR: 40.0000 mg | DELAYED_RELEASE_CAPSULE | Freq: Every day | ORAL | 0 refills | Status: AC

## 2022-08-06 MED ORDER — SODIUM CHLORIDE 0.9 % IV BOLUS
1000.0000 mL | Freq: Once | INTRAVENOUS | Status: AC
Start: 2022-08-06 — End: 2022-08-06
  Administered 2022-08-06: 1000 mL via INTRAVENOUS

## 2022-08-06 MED ORDER — FENTANYL CITRATE PF 50 MCG/ML IJ SOSY
50.0000 ug | PREFILLED_SYRINGE | Freq: Once | INTRAMUSCULAR | Status: AC
  Administered 2022-08-06: 50 ug via INTRAVENOUS
  Filled 2022-08-06: qty 1

## 2022-08-06 MED ORDER — ONDANSETRON HCL 4 MG/2ML IJ SOLN
4.0000 mg | Freq: Once | INTRAMUSCULAR | Status: AC
  Administered 2022-08-06: 4 mg via INTRAVENOUS
  Filled 2022-08-06: qty 2

## 2022-08-06 MED ORDER — PANTOPRAZOLE SODIUM 40 MG IV SOLR
40.0000 mg | Freq: Once | INTRAVENOUS | Status: AC
  Administered 2022-08-06: 40 mg via INTRAVENOUS
  Filled 2022-08-06: qty 10

## 2022-08-06 MED ORDER — IOHEXOL 300 MG/ML  SOLN
100.0000 mL | Freq: Once | INTRAMUSCULAR | Status: AC | PRN
  Administered 2022-08-06: 100 mL via INTRAVENOUS

## 2022-08-06 NOTE — ED Triage Notes (Addendum)
Pt reports epigastric pain that radiates to back since waking up Sunday morning (yesterday). Reports no appetite and nausea but no vomiting. Denies fever. Endorses HA. Denies sick contacts. No hx abd surgery, but has had cardiac surgery. LBM Sunday am. Describes pain as sharp and intermittent.

## 2022-08-06 NOTE — ED Provider Notes (Signed)
[facility] DE[facility] Provider Note: Georgena Spurling, MD, FACEP  CSN: [id number] MRN: [id number] ARRIVAL: 08/06/22 at [place]: [place]  Abdominal Pain   HISTORY OF PRESENT ILLNESS  08/06/22 4:25 AM Matthew [name] is a 41 y.o. 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  He has multiple old surgical scars on his chest and abdomen.  He is here with a week of intermittent epigastric pain associated with nausea without vomiting, anorexia and diarrhea.  The pain is sharp and he rates it as an 8 out of 10 at its worst.  It is somewhat worse with palpation of his abdomen.   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  Social History   Tobacco Use   Smoking status: Never   Smokeless tobacco: Never  Substance Use Topics   Alcohol use: No   Drug use: No    Prior to Admission medications   Medication Sig Start Date End Date Taking? Authorizing Provider  omeprazole (PRILOSEC) 40 MG capsule Take 1 capsule (40 mg total) by mouth daily. 08/06/22  Yes Ernst Cumpston, MD    Allergies Penicillins   REVIEW OF SYSTEMS  Negative except as noted here or in the History of Present Illness.   PHYSICAL EXAMINATION  Initial Vital Signs Blood pressure (!) 142/129, pulse 85, resp. rate 20, height 5\' 9"  (1.753 m), weight 56.7 kg, SpO2 97 %.  Examination General: Well-developed,  thin male in no acute distress; appearance consistent with age of record HENT: normocephalic; atraumatic Eyes: Normal appearance Neck: supple Heart: regular rate and rhythm Lungs: clear to auscultation bilaterally Chest: Multiple well-healed surgical incisions Abdomen: soft; nondistended; epigastric tenderness; multiple well-healed surgical incisions; bowel sounds present Extremities: Surgical absence of distal left index finger; full range of motion; pulses normal Neurologic: Awake, alert and oriented; motor function intact in all extremities and symmetric; no facial droop Skin: Warm and dry Psychiatric: Normal mood and affect   RESULTS  Summary of this visit's results, reviewed and interpreted by myself:   EKG Interpretation  Date/Time:  Monday August 06 2022 04:14:25 EST Ventricular Rate:  79 PR Interval:  152 QRS Duration: 78 QT Interval:  372 QTC Calculation: 426 R Axis:   250 Text Interpretation: Normal sinus rhythm Biatrial enlargement Right superior axis deviation Pulmonary disease pattern Right ventricular hypertrophy Septal infarct , age undetermined Abnormal ECG Previously Sinus Tachycardia Confirmed by Shanon Rosser [phone]) on 08/06/2022 4:17:25 AM       Laboratory Studies: Results for orders placed or performed during the hospital encounter of 08/06/22 (from the past 24 hour(s))  CBC with Differential     Status: None   Collection Time: 08/06/22  4:54 AM  Result Value Ref Range   WBC 8.6 4.0 - 10.5 K/uL   RBC 4.70 4.22 - 5.81 MIL/uL   Hemoglobin 14.3 13.0 - 17.0 g/dL   HCT 43.2 39.0 - 52.0 %  MCV 91.9 80.0 - 100.0 fL   MCH 30.4 26.0 - 34.0 pg   MCHC 33.1 30.0 - 36.0 g/dL   RDW 12.5 11.5 - 15.5 %   Platelets 195 150 - 400 K/uL   nRBC 0.0 0.0 - 0.2 %   Neutrophils Relative % 62 %   Neutro Abs 5.4 1.7 - 7.7 K/uL   Lymphocytes Relative 25 %   Lymphs Abs 2.2 0.7 - 4.0 K/uL   Monocytes Relative 11 %   Monocytes Absolute 0.9 0.1 - 1.0 K/uL   Eosinophils  Relative 1 %   Eosinophils Absolute 0.1 0.0 - 0.5 K/uL   Basophils Relative 1 %   Basophils Absolute 0.0 0.0 - 0.1 K/uL   Immature Granulocytes 0 %   Abs Immature Granulocytes 0.01 0.00 - 0.07 K/uL  Lipase, blood     Status: None   Collection Time: 08/06/22  4:54 AM  Result Value Ref Range   Lipase 36 11 - 51 U/L  Comprehensive metabolic panel     Status: Abnormal   Collection Time: 08/06/22  4:54 AM  Result Value Ref Range   Sodium 137 135 - 145 mmol/L   Potassium 3.6 3.5 - 5.1 mmol/L   Chloride 103 98 - 111 mmol/L   CO2 26 22 - 32 mmol/L   Glucose, Bld 118 (H) 70 - 99 mg/dL   BUN 18 6 - 20 mg/dL   Creatinine, Ser 1.23 0.61 - 1.24 mg/dL   Calcium 9.0 8.9 - 10.3 mg/dL   Total Protein 7.9 6.5 - 8.1 g/dL   Albumin 4.1 3.5 - 5.0 g/dL   AST 19 15 - 41 U/L   ALT 13 0 - 44 U/L   Alkaline Phosphatase 73 38 - 126 U/L   Total Bilirubin 0.6 0.3 - 1.2 mg/dL   GFR, Estimated >60 >60 mL/min   Anion gap 8 5 - 15  Urinalysis, Routine w reflex microscopic Urine, Clean Catch     Status: Abnormal   Collection Time: 08/06/22  4:54 AM  Result Value Ref Range   Color, Urine YELLOW YELLOW   APPearance TURBID (A) CLEAR   Specific Gravity, Urine >=1.030 1.005 - 1.030   pH 6.0 5.0 - 8.0   Glucose, UA NEGATIVE NEGATIVE mg/dL   Hgb urine dipstick SMALL (A) NEGATIVE   Bilirubin Urine NEGATIVE NEGATIVE   Ketones, ur NEGATIVE NEGATIVE mg/dL   Protein, ur 30 (A) NEGATIVE mg/dL   Nitrite NEGATIVE NEGATIVE   Leukocytes,Ua SMALL (A) NEGATIVE  Urinalysis, Microscopic (reflex)     Status: Abnormal   Collection Time: 08/06/22  4:54 AM  Result Value Ref Range   RBC / HPF 11-20 0 - 5 RBC/hpf   WBC, UA 6-10 0 - 5 WBC/hpf   Bacteria, UA FEW (A) NONE SEEN   Squamous Epithelial / LPF 0-5 0 - 5   Mucus PRESENT    Imaging Studies: CT ABDOMEN PELVIS W CONTRAST  Result Date: 08/06/2022 CLINICAL DATA:  41 year old male with history of epigastric pain radiating into the back. EXAM: CT ABDOMEN AND PELVIS WITH  CONTRAST TECHNIQUE: Multidetector CT imaging of the abdomen and pelvis was performed using the standard protocol following bolus administration of intravenous contrast. RADIATION DOSE REDUCTION: This exam was performed according to the departmental dose-optimization program which includes automated exposure control, adjustment of the mA and/or kV according to patient size and/or use of iterative reconstruction technique. CONTRAST:  110mL OMNIPAQUE IOHEXOL 300 MG/ML  SOLN COMPARISON:  No priors. FINDINGS: Lower chest: 2.3  x 1.4 cm nodular area of architectural distortion in the left lower lobe (axial image 4 of series 4) with surrounding ground-glass attenuation, likely of infectious or inflammatory etiology. 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 No other suspicious hepatic lesions. No intra or extrahepatic biliary ductal dilatation. Gallbladder is nearly decompressed, but otherwise unremarkable in appearance. Pancreas: No pancreatic mass. No pancreatic ductal dilatation. No pancreatic or peripancreatic fluid collections or inflammatory changes. Spleen: Unremarkable. Adrenals/Urinary Tract: Bilateral kidneys and bilateral adrenal glands are normal in appearance. No hydroureteronephrosis. Urinary bladder is nearly decompressed, but otherwise unremarkable in appearance. Stomach/Bowel: The appearance of the stomach is normal. There is no pathologic dilatation of small bowel or colon. Normal appendix. Vascular/Lymphatic: Mild atherosclerosis in the pelvic vasculature. No aneurysm or dissection noted in the abdominal or pelvic vasculature. No lymphadenopathy noted in the abdomen or pelvis. Reproductive: Prostate gland and seminal vesicles are unremarkable in appearance. Other: No  significant volume of ascites.  No pneumoperitoneum. Musculoskeletal: There are no aggressive appearing lytic or blastic lesions noted in the visualized portions of the skeleton. IMPRESSION: 1. No acute findings are noted in the abdomen or pelvis to account for the patient's symptoms. 2. 2.3 x 1.4 cm slightly ill-defined nodular area of architectural distortion in the left lower lobe. Given the patient's young age, this is likely of infectious or inflammatory etiology. Follow-up noncontrast chest CT is recommended in 1 month after trial of antimicrobial therapy to ensure complete resolution of this finding to exclude neoplasm. 3. 2.7 x 2.1 cm lesion in the right lobe of the liver with imaging characteristics most compatible with a cavernous hemangioma. This is technically incompletely characterized on today's CT examination (which did not include delayed images). This could be definitively characterized with follow-up nonemergent outpatient abdominal MRI with and without IV gadolinium if of clinical concern. Electronically Signed   By: Trudie Reed M.D.   On: 08/06/2022 06:18    ED COURSE and MDM  Nursing notes, initial and subsequent vitals signs, including pulse oximetry, reviewed and interpreted by myself.  Vitals:   08/06/22 0402 08/06/22 0407 08/06/22 0506  BP:  (!) 142/129   Pulse:  85   Resp:  20   Temp:   97.9 F (36.6 C)  TempSrc:  Oral Oral  SpO2:  97%   Weight: 56.7 kg    Height: 5\' 9"  (1.753 m)     Medications  pantoprazole (PROTONIX) injection 40 mg (has no administration in time range)  sodium chloride 0.9 % bolus 1,000 mL (1,000 mLs Intravenous New Bag/Given 08/06/22 0455)  ondansetron (ZOFRAN) injection 4 mg (4 mg Intravenous Given 08/06/22 0455)  fentaNYL (SUBLIMAZE) injection 50 mcg (50 mcg Intravenous Given 08/06/22 0456)  iohexol (OMNIPAQUE) 300 MG/ML solution 100 mL (100 mLs Intravenous Contrast Given 08/06/22 0608)   The patient's CT does not show any acute pathology  to account for his symptoms.  The "ill-defined nodular area of architectural distortion" in the left lobe of the liver could represent old scarring from the patient's ARDS and subsequent placement on cardiopulmonary bypass (in lieu of ECMO which was not available at the time).  He is not febrile nor does he have a leukocytosis to suggest an acute infectious etiology.  The patient states he was told in the past he had spots on  his lungs as a result of his illness that would never "heal up".  Nevertheless, the patient was advised that he should follow-up with his primary care physician to make sure that there is no progression as he is a smoker and thus at risk for cancer.  He does not currently have a PCP but he plans to pursue finding 1.  As for the patient's epigastric pain I suspect the most likely cause is gastric and we will trial him on a PPI.   PROCEDURES  Procedures   ED DIAGNOSES     ICD-10-CM   1. Epigastric pain  R10.13          Shanon Rosser, MD 08/06/22 [phone]

## 2022-08-07 LAB — GC/CHLAMYDIA PROBE AMP (~~LOC~~) NOT AT ARMC
Chlamydia: NEGATIVE
Comment: NEGATIVE
Comment: NORMAL
Neisseria Gonorrhea: POSITIVE — AB

## 2022-08-08 ENCOUNTER — Emergency Department (HOSPITAL_BASED_OUTPATIENT_CLINIC_OR_DEPARTMENT_OTHER)
Admission: EM | Admit: 2022-08-08 | Discharge: 2022-08-08 | Disposition: A | Discharge status: Home / Self Care | Payer: Medicaid Other | Attending: Emergency Medicine | Admitting: Emergency Medicine

## 2022-08-08 ENCOUNTER — Other Ambulatory Visit: Payer: Self-pay

## 2022-08-08 ENCOUNTER — Encounter (HOSPITAL_BASED_OUTPATIENT_CLINIC_OR_DEPARTMENT_OTHER): Payer: Self-pay | Admitting: Emergency Medicine

## 2022-08-08 DIAGNOSIS — A549 Gonococcal infection, unspecified: Secondary | ICD-10-CM | POA: Insufficient documentation

## 2022-08-08 DIAGNOSIS — Z202 Contact with and (suspected) exposure to infections with a predominantly sexual mode of transmission: Secondary | ICD-10-CM | POA: Diagnosis present

## 2022-08-08 DIAGNOSIS — A64 Unspecified sexually transmitted disease: Secondary | ICD-10-CM

## 2022-08-08 MED ORDER — DOXYCYCLINE HYCLATE 100 MG PO TABS: 100.0000 mg | ORAL_TABLET | Freq: Two times a day (BID) | ORAL | 0 refills | Status: AC

## 2022-08-08 MED ORDER — CEFTRIAXONE SODIUM 500 MG IJ SOLR
500.0000 mg | Freq: Once | INTRAMUSCULAR | Status: AC
  Administered 2022-08-08: 500 mg via INTRAMUSCULAR
  Filled 2022-08-08: qty 500

## 2022-08-08 MED ORDER — LIDOCAINE HCL (PF) 1 % IJ SOLN
1.0000 mL | Freq: Once | INTRAMUSCULAR | Status: AC
  Administered 2022-08-08: 2 mL
  Filled 2022-08-08: qty 5

## 2022-08-08 NOTE — ED Provider Notes (Signed)
[facility] EMERGENCY DEPARTMENT Provider Note   CSN: [id number] Arrival date & time: 08/08/22  0303     History  Chief Complaint  Patient presents with   SEXUALLY TRANSMITTED DISEASE    Matthew [name] is a 41 y.o. male.  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[facility])     Past Surgical History:  Procedure Laterality Date   CANNULATION FOR CARDIOPULMONARY BYPASS N/A 09/06/2015   Procedure: CANNULATION FOR ECMO;  Surgeon: Kerin Perna, MD;  Location: [facility] OR;  Service: Open Heart Surgery;  Laterality: N/A;   HAND SURGERY     INTRAOPERATIVE TRANSESOPHAGEAL ECHOCARDIOGRAM N/A 09/06/2015   Procedure: INTRAOPERATIVE TRANSESOPHAGEAL ECHOCARDIOGRAM;  Surgeon: Kerin Perna, MD;  Location: [facility] OR;  Service: Open Heart Surgery;  Laterality: N/A;     The history is provided by the patient. No language interpreter was used.       Home Medications Prior to Admission medications   Medication Sig Start Date End Date Taking? Authorizing Provider  doxycycline (VIBRA-TABS) 100 MG tablet Take 1 tablet (100 mg total) by mouth 2 (two) times daily. 08/08/22  Yes Sloan Leiter, DO  omeprazole (PRILOSEC) 40 MG capsule Take 1 capsule (40 mg total) by mouth daily. 08/06/22   Molpus, John, MD      Allergies    Penicillins    Review of Systems   Review of Systems  Constitutional:  Negative for chills and fever.  HENT:  Negative for facial swelling and trouble swallowing.   Eyes:  Negative for photophobia and visual disturbance.   Respiratory:  Negative for cough and shortness of breath.   Cardiovascular:  Negative for chest pain and palpitations.  Gastrointestinal:  Negative for abdominal pain, nausea and vomiting.  Endocrine: Negative for polydipsia and polyuria.  Genitourinary:  Positive for penile discharge. Negative for difficulty urinating and hematuria.  Musculoskeletal:  Negative for gait problem and joint swelling.  Skin:  Negative for pallor and rash.  Neurological:  Negative for syncope and headaches.  Psychiatric/Behavioral:  Negative for agitation and confusion.     Physical Exam Updated Vital Signs BP 111/78 (BP Location: Left Arm)   Pulse 79   Temp 97.8 F (36.6 C) (Oral)   Resp 14   Ht 5\' 9"  (1.753 m)   Wt 56.7 kg   SpO2 100%   BMI 18.46 kg/m  Physical Exam Vitals and nursing note reviewed.  Constitutional:      General: He is not in acute distress.    Appearance: Normal appearance. He is well-developed. He is not ill-appearing, toxic-appearing or diaphoretic.  HENT:     Head: Normocephalic and atraumatic.     Right Ear: External ear normal.     Left Ear: External ear normal.     Mouth/Throat:     Mouth: Mucous membranes are moist.  Eyes:     General: No scleral icterus.       Right eye: No discharge.        Left eye: No discharge.  Cardiovascular:  Rate and Rhythm: Normal rate.  Pulmonary:     Effort: Pulmonary effort is normal. No accessory muscle usage or respiratory distress.     Breath sounds: No stridor.  Abdominal:     General: Abdomen is flat. There is no distension.     Tenderness: There is no guarding.  Musculoskeletal:        General: Normal range of motion.     Cervical back: Normal range of motion.  Skin:    General: Skin is warm and dry.     Capillary Refill: Capillary refill takes less than 2 seconds.  Neurological:     Mental Status: He is alert and oriented to person, place, and time.     GCS: GCS eye subscore is 4. GCS verbal subscore is 5. GCS motor  subscore is 6.     Gait: Gait is intact.  Psychiatric:        Mood and Affect: Mood normal.        Behavior: Behavior normal.     ED Results / Procedures / Treatments   Labs (all labs ordered are listed, but only abnormal results are displayed) Labs Reviewed - No data to display  EKG None  Radiology CT ABDOMEN PELVIS W CONTRAST  Result Date: 08/06/2022 CLINICAL DATA:  41 year old male with history of epigastric pain radiating into the back. EXAM: CT ABDOMEN AND PELVIS WITH CONTRAST TECHNIQUE: Multidetector CT imaging of the abdomen and pelvis was performed using the standard protocol following bolus administration of intravenous contrast. RADIATION DOSE REDUCTION: This exam was performed according to the departmental dose-optimization program which includes automated exposure control, adjustment of the mA and/or kV according to patient size and/or use of iterative reconstruction technique. CONTRAST:  OMNIPAQUE IOHEXOL 300 MG/ML  SOLN COMPARISON:  No priors. FINDINGS: Lower chest: 2.3 x 1.4 cm nodular area of architectural distortion in the left lower lobe (axial image 4 of series 4) with surrounding ground-glass attenuation, likely of infectious or inflammatory etiology. 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 No other suspicious hepatic lesions. No intra or extrahepatic biliary ductal dilatation. Gallbladder is nearly decompressed, but otherwise unremarkable in appearance. Pancreas: No pancreatic mass. No pancreatic ductal dilatation. No pancreatic or peripancreatic fluid collections or inflammatory changes. Spleen: Unremarkable. Adrenals/Urinary Tract: Bilateral kidneys and bilateral adrenal glands are normal in appearance. No  hydroureteronephrosis. Urinary bladder is nearly decompressed, but otherwise unremarkable in appearance. Stomach/Bowel: The appearance of the stomach is normal. There is no pathologic dilatation of small bowel or colon. Normal appendix. Vascular/Lymphatic: Mild atherosclerosis in the pelvic vasculature. No aneurysm or dissection noted in the abdominal or pelvic vasculature. No lymphadenopathy noted in the abdomen or pelvis. Reproductive: Prostate gland and seminal vesicles are unremarkable in appearance. Other: No significant volume of ascites.  No pneumoperitoneum. Musculoskeletal: There are no aggressive appearing lytic or blastic lesions noted in the visualized portions of the skeleton. IMPRESSION: 1. No acute findings are noted in the abdomen or pelvis to account for the patient's symptoms. 2. 2.3 x 1.4 cm slightly ill-defined nodular area of architectural distortion in the left lower lobe. Given the patient's young age, this is likely of infectious or inflammatory etiology. Follow-up noncontrast chest CT is recommended in 1 month after trial of antimicrobial therapy to ensure complete resolution of this finding to  exclude neoplasm. 3. 2.7 x 2.1 cm lesion in the right lobe of the liver with imaging characteristics most compatible with a cavernous hemangioma. This is technically incompletely characterized on today's CT examination (which did not include delayed images). This could be definitively characterized with follow-up nonemergent outpatient abdominal MRI with and without IV gadolinium if of clinical concern. Electronically Signed   By: Trudie Reed M.D.   On: 08/06/2022 06:18    Procedures Procedures    Medications Ordered in ED Medications  cefTRIAXone (ROCEPHIN) injection 500 mg (500 mg Intramuscular Given 08/08/22 0351)  lidocaine (PF) (XYLOCAINE) 1 % injection 1-2.1 mL (2 mLs Other Given 08/08/22 0351)    ED Course/ Medical Decision Making/ A&P                           Medical  Decision Making Risk Prescription drug management.   This patient presents to the ED with chief complaint(s) of sti penile discharge with pertinent past medical history of as above which further complicates the presenting complaint. The complaint involves an extensive differential diagnosis and also carries with it a high risk of complications and morbidity.    Serious etiologies were considered.    Additional history obtained: Additional history obtained from  na Records reviewed  prior ed visits, prior lab results  Independent labs interpretation:  The following labs were independently interpreted: gc was positive from recent visit  Independent visualization of imaging: Imaging not indicated  Cardiac monitoring was reviewed and interpreted by myself which shows na  Treatment and Reassessment: Rocephin/doxy rx >> symptoms stayed the same   Consultation: - Consulted or discussed management/test interpretation w/ external professional: na  Consideration for admission or further workup: Admission was considered   Pt here with +tive sti testing, gonorrhea. Still having discharge. Give rocephin/doxy rx, avoid sexual activity until after completed abx, f/u pcp. Encouraged safe sex in the future  The patient improved significantly and was discharged in stable condition. Detailed discussions were had with the patient regarding current findings, and need for close f/u with PCP or on call doctor. The patient has been instructed to return immediately if the symptoms worsen in any way for re-evaluation. Patient verbalized understanding and is in agreement with current care plan. All questions answered prior to discharge.    Social Determinants of health: Social History   Tobacco Use   Smoking status: Never   Smokeless tobacco: Never  Substance Use Topics   Alcohol use: No   Drug use: No            Final Clinical Impression(s) / ED Diagnoses Final diagnoses:  STI  (sexually transmitted infection)    Rx / DC Orders ED Discharge Orders          Ordered    doxycycline (VIBRA-TABS) 100 MG tablet  2 times daily        08/08/22 0333              Sloan Leiter, DO 08/08/22 [phone]

## 2022-08-08 NOTE — Discharge Instructions (Addendum)
It was a pleasure caring for you today in the emergency department.  Please return to the emergency department for any worsening or worrisome symptoms.  Please refrain from sexual activity until you complete antibiotics

## 2022-08-08 NOTE — ED Triage Notes (Signed)
Pt STD exposure, drainage/discharge from penis. Gonorrhea test positive.

## 2023-02-22 ENCOUNTER — Encounter (HOSPITAL_BASED_OUTPATIENT_CLINIC_OR_DEPARTMENT_OTHER): Payer: Self-pay | Admitting: Urology

## 2023-02-22 ENCOUNTER — Emergency Department (HOSPITAL_BASED_OUTPATIENT_CLINIC_OR_DEPARTMENT_OTHER)
Admission: EM | Admit: 2023-02-22 | Discharge: 2023-02-22 | Disposition: A | Discharge status: Home / Self Care | Payer: Commercial Managed Care - HMO | Attending: Emergency Medicine | Admitting: Emergency Medicine

## 2023-02-22 DIAGNOSIS — S39012A Strain of muscle, fascia and tendon of lower back, initial encounter: Secondary | ICD-10-CM | POA: Diagnosis not present

## 2023-02-22 DIAGNOSIS — S3992XA Unspecified injury of lower back, initial encounter: Secondary | ICD-10-CM | POA: Diagnosis present

## 2023-02-22 DIAGNOSIS — R519 Headache, unspecified: Secondary | ICD-10-CM | POA: Diagnosis not present

## 2023-02-22 DIAGNOSIS — Y92410 Unspecified street and highway as the place of occurrence of the external cause: Secondary | ICD-10-CM | POA: Insufficient documentation

## 2023-02-22 MED ORDER — IBUPROFEN 600 MG PO TABS: 600.0000 mg | ORAL_TABLET | Freq: Three times a day (TID) | ORAL | 0 refills | Status: AC | PRN

## 2023-02-22 MED ORDER — IBUPROFEN 400 MG PO TABS
600.0000 mg | ORAL_TABLET | Freq: Once | ORAL | Status: AC
  Administered 2023-02-22: 600 mg via ORAL
  Filled 2023-02-22: qty 1

## 2023-02-22 MED ORDER — METHOCARBAMOL 500 MG PO TABS
500.0000 mg | ORAL_TABLET | Freq: Once | ORAL | Status: AC
  Administered 2023-02-22: 500 mg via ORAL
  Filled 2023-02-22: qty 1

## 2023-02-22 MED ORDER — METHOCARBAMOL 500 MG PO TABS: 500.0000 mg | ORAL_TABLET | Freq: Three times a day (TID) | ORAL | 0 refills | Status: AC | PRN
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
## 2024-02-12 ENCOUNTER — Encounter (HOSPITAL_BASED_OUTPATIENT_CLINIC_OR_DEPARTMENT_OTHER): Payer: Self-pay | Admitting: Emergency Medicine

## 2024-02-12 ENCOUNTER — Emergency Department (HOSPITAL_BASED_OUTPATIENT_CLINIC_OR_DEPARTMENT_OTHER)
Admission: EM | Admit: 2024-02-12 | Discharge: 2024-02-12 | Disposition: A | Discharge status: Home / Self Care | Attending: Emergency Medicine | Admitting: Emergency Medicine

## 2024-02-12 ENCOUNTER — Other Ambulatory Visit: Payer: Self-pay

## 2024-02-12 DIAGNOSIS — K0889 Other specified disorders of teeth and supporting structures: Secondary | ICD-10-CM

## 2024-02-12 DIAGNOSIS — K029 Dental caries, unspecified: Secondary | ICD-10-CM | POA: Insufficient documentation

## 2024-02-12 MED ORDER — CELECOXIB 200 MG PO CAPS: 200.0000 mg | ORAL_CAPSULE | Freq: Two times a day (BID) | ORAL | 0 refills | Status: AC | PRN

## 2024-02-12 MED ORDER — CLINDAMYCIN HCL 300 MG PO CAPS: 300.0000 mg | ORAL_CAPSULE | Freq: Three times a day (TID) | ORAL | 0 refills | Status: AC

## 2024-02-12 NOTE — ED Notes (Signed)
 Discharge instructions reviewed with patient. Patient verbalizes understanding, no further questions at this time. Medications/prescriptions and follow up information provided. No acute distress noted at time of departure.

## 2024-02-12 NOTE — ED Provider Notes (Signed)
 [place] [facility] AT [facility] Provider Note   CSN: [id number] Arrival date & time: 02/12/24  1122     Patient presents with: Facial Swelling   Matthew [name] is a 43 y.o. male.   HPI   43 year old male presents emergency department complaints of left upper dental pain as well as some swelling.  Has had pain for the past 3 to 4 days with swelling noticed this morning.  States he has a known fractured tooth in the area as well as multiple cavities.  States he has an appointment with the dentist in a month and 1/2 to 2 months to have affected tooth addressed.  Has not been on antibiotics recently.  Denies any difficulty breathing/swallowing, floor of mouth swelling.  Presents emergency department for further assessment/evaluation.  Past medical history significant for AKI, CAP,  Prior to Admission medications   Medication Sig Start Date End Date Taking? Authorizing Provider  celecoxib  (CELEBREX ) 200 MG capsule Take 1 capsule (200 mg total) by mouth 2 (two) times daily as needed. 02/12/24  Yes Silver Fell A, PA  clindamycin  (CLEOCIN ) 300 MG capsule Take 1 capsule (300 mg total) by mouth 3 (three) times daily for 10 days. 02/12/24 02/22/24 Yes Silver Fell A, PA  doxycycline  (VIBRA -TABS) 100 MG tablet Take 1 tablet (100 mg total) by mouth 2 (two) times daily. 08/08/22   Elnor Jayson LABOR, DO  ibuprofen  (ADVIL ) 600 MG tablet Take 1 tablet (600 mg total) by mouth every 8 (eight) hours as needed. 02/22/23   Griselda Norris, MD  methocarbamol  (ROBAXIN ) 500 MG tablet Take 1 tablet (500 mg total) by mouth every 8 (eight) hours as needed for muscle spasms. 02/22/23   Griselda Norris, MD  omeprazole  (PRILOSEC) 40 MG capsule Take 1 capsule (40 mg total) by mouth daily. 08/06/22   Molpus, John, MD    Allergies: Penicillins    Review of Systems  All other systems reviewed and are negative.   Updated Vital Signs BP 110/79   Pulse 90   Temp 98.3 F (36.8 C) (Oral)    Resp 15   Ht 5' 9 (1.753 m)   Wt 61.2 kg   SpO2 99%   BMI 19.94 kg/m   Physical Exam Vitals and nursing note reviewed.  Constitutional:      General: He is not in acute distress.    Appearance: He is well-developed.  HENT:     Head: Normocephalic and atraumatic.     Mouth/Throat:      Comments: Multiple fractured and carious teeth.  Tooth of concern as above fractured as well as caries present.  Tenderness along gingiva.  No obvious.  Buccal abscess.  No sublingual extremity or swelling.  No trismus.  Uvula midline and rises symmetric with phonation.  Eyes:     Conjunctiva/sclera: Conjunctivae normal.    Cardiovascular:     Rate and Rhythm: Normal rate and regular rhythm.     Heart sounds: No murmur heard. Pulmonary:     Effort: Pulmonary effort is normal. No respiratory distress.     Breath sounds: Normal breath sounds.  Abdominal:     Palpations: Abdomen is soft.     Tenderness: There is no abdominal tenderness.   Musculoskeletal:        General: No swelling.     Cervical back: Neck supple.   Skin:    General: Skin is warm and dry.     Capillary Refill: Capillary refill takes less than 2 seconds.  Neurological:     Mental Status: He is alert.   Psychiatric:        Mood and Affect: Mood normal.     (all labs ordered are listed, but only abnormal results are displayed) Labs Reviewed - No data to display  EKG: None  Radiology: No results found.   Procedures   Medications Ordered in the ED - No data to display                                  Medical Decision Making Risk Prescription drug management.   This patient presents to the ED for concern of dental pain, this involves an extensive number of treatment options, and is a complaint that carries with it a high risk of complications and morbidity.  The differential diagnosis includes periapical abscess, cellulitis, necrotizing ulcerative gingivitis, Ludwig angina, other   Co morbidities that  complicate the patient evaluation  See HPI   Additional history obtained:  Additional history obtained from EMR External records from outside source obtained and reviewed including hospital records   Lab Tests:  N/a   Imaging Studies ordered:  N/a   Cardiac Monitoring: / EKG:  N/a   Consultations Obtained:  N/a   Problem List / ED Course / Critical interventions / Medication management  Dental pain Reevaluation of the patient showed that the patient stayed the same I have reviewed the patients home medicines and have made adjustments as needed   Social Determinants of Health:  No tobacco, licit drug use.   Test / Admission - Considered:  Dental pain Vitals signs within normal range and stable throughout visit. 43 year old male presents emergency department complaints of left upper dental pain as well as some swelling.  Has had pain for the past 3 to 4 days with swelling noticed this morning.  States he has a known fractured tooth in the area as well as multiple cavities.  States he has an appointment with the dentist in a month and 1/2 to 2 months to have affected tooth addressed.  Has not been on antibiotics recently.  Denies any difficulty breathing/swallowing, floor of mouth swelling.  Presents emergency department for further assessment/evaluation. On exam, multiple carious and fractured teeth as above.  Left upper molar fractured with surrounding gingival tenderness as well as swelling.  No evidence clinically of periapical abscess or Ludwig angina.  Will treat empirically with antibiotics given concern for bacterial infectious process.  Will recommend continued treatment of pain with NSAIDs as well as adequate oral hydration.  Recommend follow-up with dental specialist as patient already has planned appointment.  Treatment plan discussed with patient and he is understanding was agreeable to said plan.  Patient will well-appearing, afebrile in no acute  distress. Worrisome signs and symptoms were discussed with the patient, and the patient acknowledged understanding to return to the ED if noticed. Patient was stable upon discharge.       Final diagnoses:  Pain, dental    ED Discharge Orders          Ordered    clindamycin  (CLEOCIN ) 300 MG capsule  3 times daily        02/12/24 1217    celecoxib  (CELEBREX ) 200 MG capsule  2 times daily PRN        02/12/24 1217               Silver Wonda LABOR, [place] 02/12/24 1237  Darra Fonda MATSU, MD 02/21/24 [phone]

## 2024-02-12 NOTE — ED Triage Notes (Signed)
 Pt POV reports L facial swelling x3 days.  Reports subjective fever, n/v Monday.     Denies n/v today.  Poor po intake x2 days, reports known dental caries to effected side.

## 2024-02-12 NOTE — Discharge Instructions (Addendum)
 As discussed, will place you on antibiotics given concern for dental infection as well as anti-inflammatories for pain.  Return if you develop any worrisome signs and symptoms we discussed.  Recommend follow-up with dentistry for having your affected tooth addressed.
